# Patient Record
Sex: Male | Born: 1995 | Race: White | Hispanic: No | Marital: Single | Smoking: Never smoker
Health system: Southern US, Community
[De-identification: ages and names within clinical notes are randomized; demographics above are authoritative.]

## PROBLEM LIST (undated history)

## (undated) DIAGNOSIS — Z794 Long term (current) use of insulin: Secondary | ICD-10-CM

## (undated) DIAGNOSIS — B0089 Other herpesviral infection: Secondary | ICD-10-CM

## (undated) DIAGNOSIS — S62102A Fracture of unspecified carpal bone, left wrist, initial encounter for closed fracture: Secondary | ICD-10-CM

## (undated) DIAGNOSIS — E119 Type 2 diabetes mellitus without complications: Secondary | ICD-10-CM

## (undated) HISTORY — DX: Fracture of unspecified carpal bone, left wrist, initial encounter for closed fracture: S62.102A

## (undated) HISTORY — DX: Type 2 diabetes mellitus without complications: E11.9

## (undated) HISTORY — PX: OTHER SURGICAL HISTORY: SHX169

## (undated) HISTORY — DX: Long term (current) use of insulin: Z79.4

## (undated) HISTORY — DX: Other herpesviral infection: B00.89

---
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
## 2014-01-05 ENCOUNTER — Telehealth: Payer: Self-pay

## 2014-01-05 NOTE — Telephone Encounter (Signed)
pts mother request copy of immunizations in our office; pt going to college in fall. pts mother will ck with school for previous immunizations. Pt will pick up immunization record at front desk.

## 2014-01-11 ENCOUNTER — Ambulatory Visit (INDEPENDENT_AMBULATORY_CARE_PROVIDER_SITE_OTHER): Payer: BC Managed Care – PPO | Admitting: Internal Medicine

## 2014-01-11 ENCOUNTER — Encounter: Payer: Self-pay | Admitting: Internal Medicine

## 2014-01-11 VITALS — BP 126/82 | HR 80 | Temp 98.4°F | Wt 161.0 lb

## 2014-01-11 DIAGNOSIS — B86 Scabies: Secondary | ICD-10-CM

## 2014-01-11 MED ORDER — PERMETHRIN 5 % EX CREA: TOPICAL_CREAM | CUTANEOUS | Status: DC

## 2014-01-11 NOTE — Assessment & Plan Note (Signed)
Rx permethrin and gave education handout. If worsens, persists or changes notify us.

## 2014-01-11 NOTE — Progress Notes (Signed)
Pre visit review using our clinic review tool, if applicable. No additional management support is needed unless otherwise documented below in the visit note. 

## 2014-01-11 NOTE — Progress Notes (Signed)
   Subjective:    Patient ID: Patrick Williams, male    DOB: [birth year], 18 y.o.   MRN: [id number]  HPI  Pt in with rash on his ankles, wrist and some on his waist. He states present for 2 wks. Came up [facility] orientation [facility] [employer]. He slept on bed without bedsheets. Then next day got the rash. Slight rash around his umbilicus. Some on base of his thumbs.   No direct know family or friends with scabies but this is his concern.    Review of Systems  Constitutional: Negative.   Respiratory: Negative.   Cardiovascular: Negative.   Skin: Positive for rash.       Wrist, waist and ankles. Does itch.       Objective:   Physical Exam  Constitutional: He appears well-developed and well-nourished.  Cardiovascular: Normal rate, regular rhythm and normal heart sounds.   Pulmonary/Chest: Effort normal and breath sounds normal.  Skin: Skin is warm and dry. Rash noted.  Slight small  scabs on ventral wrists. Residual small areas of scabs and hyperpigmented region where had prior small scabs and slight redness to skin on both ankles and waist. No warmth, tenderness or discharge.           Assessment & Plan:

## 2014-01-11 NOTE — Progress Notes (Signed)
Subjective:    Patient ID: Patrick Williams, male    DOB: [birth year], 18 y.o.   MRN: [id number]  HPI  Pt presents to the clinic today with c/o a rash. This started 2 weeks ago. He reports the rash is located on his hands, wrist, arms, ankles and abdomen. It is itchy. It seems to be worse at night. He did go to orientation at [employer] and spent the night in one of the dorms. The rash started after that. He has tried hydrocortisone cream without relief. No one in his house has any similar symptoms.  Review of Systems    ,  Past Medical History  Diagnosis Date  . IDDM (insulin dependent diabetes mellitus) 1999    [facility].  No admissions for overnight stays with hyperglycemia since dx. as of 10/11.  . Wrist fracture, left 2007    resolved w/o deficit  . Herpes gladiatorum     history    Current Outpatient Prescriptions  Medication Sig Dispense Refill  . ibuprofen (ADVIL,MOTRIN) 200 MG tablet Take 200 mg by mouth as needed.        . insulin glargine (LANTUS) 100 UNIT/ML injection Inject 30 Units into the skin at bedtime.       . insulin lispro (HUMALOG KWIKPEN) 100 UNIT/ML injection Sliding scale per [facility]      . valACYclovir (VALTREX) 1000 MG tablet Take 1 tablet (1,000 mg total) by mouth 3 (three) times daily.  21 tablet  0   No current facility-administered medications for this visit.    No Known Allergies  Family History  Problem Relation Age of Onset  . CAD Neg Hx   . Hypertension Neg Hx   . Cancer Neg Hx   . Stroke Neg Hx   . Diabetes Neg Hx     History   Social History  . Marital Status: Single    Spouse Name: N/A    Number of Children: N/A  . Years of Education: N/A   Occupational History  . Not on file.   Social History Main Topics  . Smoking status: Never Smoker   . Smokeless tobacco: Never Used  . Alcohol Use: No  . Drug Use: No  . Sexual Activity: No   Other Topics Concern  . 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   Lives with Mother, younger sister and Step-Dad, 10 dogs, 3 cats, 1 pig   BioFather in [place]   Hobbies:  wrestling   Moved to [place] at age 89     Constitutional: Denies fever, malaise, fatigue, headache or abrupt weight changes.   Skin: Pt reports rash. Denies redness,  or ulcercations.    No other specific complaints in a complete review of systems (except as listed in HPI above).  Objective:   Physical Exam  BP 126/82  Pulse 80  Temp(Src) 98.4 F (36.9 C) (Oral)  Wt 161 lb (73.029 kg)  SpO2 98% Wt Readings from Last 3 Encounters:  01/11/14 161 lb (73.029 kg) (65%*, Z = 0.39)  07/21/13 155 lb 8 oz (70.534 kg) (61%*, Z = 0.27)  07/09/13 152 lb 8 oz (69.174 kg) (56%*, Z = 0.16)   * Growth percentiles are based on CDC 2-20 Years data.    General: Appears their stated age, well developed, well nourished in NAD. Skin: Multiple scabbed lesions noted in between fingers, on anterior surface of wrist, around the waist and ankles.  Cardiovascular: Normal rate and rhythm. S1,S2 noted.  No murmur, rubs or gallops noted. No JVD or BLE edema. No carotid bruits noted. Pulmonary/Chest: Normal effort and positive vesicular breath sounds. No respiratory distress. No wheezes, rales or ronchi noted.         Assessment & Plan:   Scabies:  Wash all clothes and sheets in hot water eRx for permetherin cream- use topically  RTC as needed or if symptoms persist or worsen

## 2014-01-11 NOTE — Patient Instructions (Signed)

## 2014-02-07 ENCOUNTER — Telehealth: Payer: Self-pay | Admitting: Family Medicine

## 2014-02-07 NOTE — Telephone Encounter (Signed)
In your IN box for completion.  

## 2014-02-07 NOTE — Telephone Encounter (Signed)
Pt dropped off college forms to be filled out Put on kims desk

## 2014-02-08 NOTE — Telephone Encounter (Signed)
No documented 2nd varicella. Patient notified and said he will discuss with mom and call back to schedule nurse visit if she wants him to have it. Paperwork placed up front for pick up.

## 2014-02-08 NOTE — Telephone Encounter (Signed)
Filled and placed in Kim's box. plz review [facility] - looks like may need 2nd varicella shot which he may receive if interested when he picks up forms.

## 2014-02-10 NOTE — Telephone Encounter (Signed)
Frann RiderLinda Hyatt immunization nurse at [facility] request immunization record from our office with office stamp and doctors signature faxed to [phone]. Verifed fax went thru.

## 2014-03-10 ENCOUNTER — Ambulatory Visit: Payer: BC Managed Care – PPO | Admitting: Internal Medicine

## 2014-03-11 ENCOUNTER — Ambulatory Visit (INDEPENDENT_AMBULATORY_CARE_PROVIDER_SITE_OTHER): Payer: BC Managed Care – PPO | Admitting: Internal Medicine

## 2014-03-11 ENCOUNTER — Encounter: Payer: Self-pay | Admitting: Internal Medicine

## 2014-03-11 VITALS — BP 120/64 | HR 71 | Temp 98.8°F | Wt 169.0 lb

## 2014-03-11 DIAGNOSIS — R590 Localized enlarged lymph nodes: Secondary | ICD-10-CM

## 2014-03-11 DIAGNOSIS — R599 Enlarged lymph nodes, unspecified: Secondary | ICD-10-CM

## 2014-03-11 LAB — CBC WITH DIFFERENTIAL/PLATELET
Basophils Absolute: 0 10*3/uL (ref 0.0–0.1)
Basophils Relative: 0 % (ref 0–1)
EOS ABS: 0.1 10*3/uL (ref 0.0–0.7)
EOS PCT: 2 % (ref 0–5)
HCT: 41.3 % (ref 39.0–52.0)
HEMOGLOBIN: 14.5 g/dL (ref 13.0–17.0)
LYMPHS ABS: 2.2 10*3/uL (ref 0.7–4.0)
Lymphocytes Relative: 41 % (ref 12–46)
MCH: 30 pg (ref 26.0–34.0)
MCHC: 35.1 g/dL (ref 30.0–36.0)
MCV: 85.5 fL (ref 78.0–100.0)
MONO ABS: 0.3 10*3/uL (ref 0.1–1.0)
MONOS PCT: 6 % (ref 3–12)
Neutro Abs: 2.7 10*3/uL (ref 1.7–7.7)
Neutrophils Relative %: 51 % (ref 43–77)
Platelets: 239 10*3/uL (ref 150–400)
RBC: 4.83 MIL/uL (ref 4.22–5.81)
RDW: 12.6 % (ref 11.5–15.5)
WBC: 5.3 10*3/uL (ref 4.0–10.5)

## 2014-03-11 NOTE — Progress Notes (Addendum)
   Subjective:    Patient ID: Patrick Williams, male    DOB: December 21, 1995, 18 y.o.   MRN: [id number]  HPI    Review of Systems     Objective:   Physical Exam        Assessment & Plan:

## 2014-03-11 NOTE — Progress Notes (Signed)
Pre visit review using our clinic review tool, if applicable. No additional management support is needed unless otherwise documented below in the visit note. 

## 2014-03-11 NOTE — Progress Notes (Signed)
Subjective:    Patient ID: Patrick Williams, male    DOB: 1996/01/01, 18 y.o.   MRN: [id number]  HPI  Pt presents to the clinic today with c/o swollen lymph nodes. He noticed this 2-3 weeks ago. It is swollen under his chin by his right ear. It was tender at one point but the tenderness has resolved. He reports that the swelling does not seem to be decreasing. He denies any URI symptoms. He denies fever, chills or weight loss.  Review of Systems      Past Medical History  Diagnosis Date  . IDDM (insulin dependent diabetes mellitus) 1999    followed at [facility].  No admissions for overnight stays with hyperglycemia since dx. as of 10/11.  . Wrist fracture, left 2007    resolved w/o deficit  . Herpes gladiatorum     history    Current Outpatient Prescriptions  Medication Sig Dispense Refill  . ibuprofen (ADVIL,MOTRIN) 200 MG tablet Take 200 mg by mouth as needed.        . insulin glargine (LANTUS) 100 UNIT/ML injection Inject 30 Units into the skin at bedtime.       . insulin lispro (HUMALOG KWIKPEN) 100 UNIT/ML injection Sliding scale per [facility]      . permethrin (ELIMITE) 5 % cream Apply to back of neck to soles of feet. Rinse of 10-14 hours after tx. Repeat in 2wks if needed.  60 g  0  . valACYclovir (VALTREX) 1000 MG tablet Take 1 tablet (1,000 mg total) by mouth 3 (three) times daily.  21 tablet  0   No current facility-administered medications for this visit.    No Known Allergies  Family History  Problem Relation Age of Onset  . CAD Neg Hx   . Hypertension Neg Hx   . Cancer Neg Hx   . Stroke Neg Hx   . Diabetes Neg Hx     History   Social History  . Marital Status: Single    Spouse Name: N/A    Number of Children: N/A  . Years of Education: N/A   Occupational History  . Not on file.   Social History Main Topics  . Smoking status: Never Smoker   . Smokeless tobacco: Never Used  . Alcohol Use: No  . Drug Use: No  . Sexual Activity: No   Other Topics Concern    . 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   Lives with Mother, younger sister and Step-Dad, 10 dogs, 3 cats, 1 pig   BioFather in [place]   Hobbies:  wrestling   Moved to  at age 71     Constitutional: Denies fever, malaise, fatigue, headache or abrupt weight changes.  HEENT: Denies eye pain, eye redness, ear pain, ringing in the ears, wax buildup, runny nose, nasal congestion, bloody nose, or sore throat. Respiratory: Denies difficulty breathing, shortness of breath, cough or sputum production.   Skin: Pt reports swollen lymph nodes. Denies redness, rashes, lesions or ulcercations.    No other specific complaints in a complete review of systems (except as listed in HPI above).  Objective:   Physical Exam  BP 120/64  Pulse 71  Temp(Src) 98.8 F (37.1 C) (Oral)  Wt 169 lb (76.658 kg)  SpO2 99% Wt Readings from Last 3 Encounters:  03/11/14 169 lb (76.658 kg) (74%*, Z = 0.65)  01/11/14 161 lb (73.029 kg) (65%*, Z = 0.39)  07/21/13  155 lb 8 oz (70.534 kg) (61%*, Z = 0.27)   * Growth percentiles are based on CDC 2-20 Years data.    General: Appears his stated age, well developed, well nourished in NAD. HEENT:  Ears: bilateral cerumen impaction; Throat/Mouth: Teeth present, mucosa pink and moist, no exudate, lesions or ulcerations noted. Right side preauricular lymphadenopathy noted. Mobile and soft, < 1 cm, nontender. Cardiovascular: Normal rate and rhythm. S1,S2 noted.  No murmur, rubs or gallops noted.  Pulmonary/Chest: Normal effort and positive vesicular breath sounds. No respiratory distress. No wheezes, rales or ronchi noted.        Assessment & Plan:   Preauricular lymphadenopathy:  No red flags today Will get CBC with diff If lymph node becomes larger, let me know and we will ultrasound it  RTC as needed or if symptoms persist or worsen

## 2014-03-11 NOTE — Patient Instructions (Signed)

## 2014-04-29 ENCOUNTER — Ambulatory Visit: Payer: BC Managed Care – PPO

## 2014-05-04 ENCOUNTER — Ambulatory Visit (INDEPENDENT_AMBULATORY_CARE_PROVIDER_SITE_OTHER): Payer: BC Managed Care – PPO

## 2014-05-04 ENCOUNTER — Ambulatory Visit: Payer: BC Managed Care – PPO

## 2014-05-04 DIAGNOSIS — Z23 Encounter for immunization: Secondary | ICD-10-CM

## 2014-09-27 ENCOUNTER — Ambulatory Visit (INDEPENDENT_AMBULATORY_CARE_PROVIDER_SITE_OTHER): Payer: BLUE CROSS/BLUE SHIELD | Admitting: Family Medicine

## 2014-09-27 ENCOUNTER — Encounter: Payer: Self-pay | Admitting: Family Medicine

## 2014-09-27 VITALS — BP 124/78 | HR 88 | Temp 98.1°F | Wt 167.0 lb

## 2014-09-27 DIAGNOSIS — B079 Viral wart, unspecified: Secondary | ICD-10-CM

## 2014-09-27 DIAGNOSIS — B078 Other viral warts: Secondary | ICD-10-CM | POA: Insufficient documentation

## 2014-09-27 NOTE — Progress Notes (Signed)
Pre visit review using our clinic review tool, if applicable. No additional management support is needed unless otherwise documented below in the visit note. 

## 2014-09-27 NOTE — Progress Notes (Signed)
   BP 124/78 mmHg  Pulse 88  Temp(Src) 98.1 F (36.7 C) (Oral)  Wt 167 lb (75.751 kg)  SpO2 98%   CC: check wart on R hand  Subjective:    Patient ID: Patrick Williams, male    DOB: [birth year], 19 y.o.   MRN: [id number]  HPI: Patrick Williams is a 19 y.o. male presenting on 09/27/2014 for Verrucous Vulgaris   "wart on R hand that won't go away". Actually several spots - R index dorsal MCP as well as R anterior knee and posterior calf. Present for last 3-4 weeks. Has tried freeze off and salicylic acid x 1 week.   No itching. Painful.  T1DM - sugars overall stable. Latest A1c 7s. [facility] Lab Results  Component Value Date   HGBA1C 7.0 02/19/2007    Relevant past medical, surgical, family and social history reviewed and updated as indicated. Interim medical history since our last visit reviewed. Allergies and medications reviewed and updated. Current Outpatient Prescriptions on File Prior to Visit  Medication Sig  . ibuprofen (ADVIL,MOTRIN) 200 MG tablet Take 200 mg by mouth as needed.    . insulin glargine (LANTUS) 100 UNIT/ML injection Inject 30 Units into the skin at bedtime.   . insulin lispro (HUMALOG KWIKPEN) 100 UNIT/ML injection Sliding scale per [facility]   No current facility-administered medications on file prior to visit.    Review of Systems Per HPI unless specifically indicated above     Objective:    BP 124/78 mmHg  Pulse 88  Temp(Src) 98.1 F (36.7 C) (Oral)  Wt 167 lb (75.751 kg)  SpO2 98%  Wt Readings from Last 3 Encounters:  09/27/14 167 lb (75.751 kg) (69 %*, Z = 0.51)  03/11/14 169 lb (76.658 kg) (74 %*, Z = 0.65)  01/11/14 161 lb (73.029 kg) (65 %*, Z = 0.39)   * Growth percentiles are based on CDC 2-20 Years data.    Physical Exam  Constitutional: He appears well-developed and well-nourished. No distress.  Musculoskeletal: He exhibits no edema.  Skin: Skin is warm and dry.  R anterior knee with 2 warts nearby, L lateral posterior  lower calf with smaller wart R dorsal MCPJ with 1cm exophytic growth with central crack/erosion.  Nursing note and vitals reviewed.  Liquid nitrogen was applied for 10-12 seconds to the skin lesion (knee and calf) and the expected blistering or scabbing reaction explained. Do not pick at the area. Return if lesion fails to fully resolve.    Assessment & Plan:   Problem List Items Addressed This Visit    Verruca vulgaris - Primary    R leg lesions x2 treated with LN2 therapy today. R hand lesion not treated 2/2 already irritated. rec continue salicylic acid home treatment, discussed treatment strategy, discussed duct tape use. Update if not improving with treatment, discussed possible need for rpt LN2 therapy.          Follow up plan: Return if symptoms worsen or fail to improve.

## 2014-09-27 NOTE — Patient Instructions (Addendum)
Leg warts freezed off today - typical reaction is blistering then peeling of wart. May need to be repeated. Keep area clean and dry. For hand - continue using salicylic acid 4-5 days in a row then allow skin to heal. Continue this process. Update us if no improvement in 3-4 wks.

## 2014-09-27 NOTE — Assessment & Plan Note (Addendum)
R leg lesions x2 treated with LN2 therapy today. R hand lesion not treated 2/2 already irritated. rec continue salicylic acid home treatment, discussed treatment strategy, discussed duct tape use. Update if not improving with treatment, discussed possible need for rpt LN2 therapy.

## 2014-12-19 ENCOUNTER — Telehealth: Payer: Self-pay

## 2014-12-19 NOTE — Telephone Encounter (Signed)
Diabetic Bundle. I contacted the pt and advised his A1C blood test is due. Pt declined to make lab appointment at this time.

## 2015-04-21 ENCOUNTER — Ambulatory Visit (INDEPENDENT_AMBULATORY_CARE_PROVIDER_SITE_OTHER): Payer: BLUE CROSS/BLUE SHIELD

## 2015-04-21 DIAGNOSIS — Z23 Encounter for immunization: Secondary | ICD-10-CM | POA: Diagnosis not present

## 2016-02-22 LAB — HEMOGLOBIN A1C: Hemoglobin A1C: 8.2

## 2016-02-22 LAB — MICROALBUMIN, URINE

## 2016-05-13 ENCOUNTER — Telehealth: Payer: Self-pay | Admitting: Family Medicine

## 2016-05-13 NOTE — Telephone Encounter (Signed)
PT called to reschedule his cpe for tomorrow 11/14 because he is in class. Your next time is in January. Is it ok to schedule him in a 30 min spot the week after Thanksgiving?

## 2016-05-13 NOTE — Telephone Encounter (Signed)
Ok to do thanks. 

## 2016-05-14 ENCOUNTER — Encounter: Payer: BLUE CROSS/BLUE SHIELD | Admitting: Family Medicine

## 2016-05-29 ENCOUNTER — Encounter: Payer: Self-pay | Admitting: Family Medicine

## 2016-05-29 ENCOUNTER — Ambulatory Visit (INDEPENDENT_AMBULATORY_CARE_PROVIDER_SITE_OTHER): Payer: BLUE CROSS/BLUE SHIELD | Admitting: Family Medicine

## 2016-05-29 VITALS — BP 122/80 | HR 68 | Temp 98.2°F | Ht 69.75 in | Wt 177.5 lb

## 2016-05-29 DIAGNOSIS — Z113 Encounter for screening for infections with a predominantly sexual mode of transmission: Secondary | ICD-10-CM

## 2016-05-29 DIAGNOSIS — Z0000 Encounter for general adult medical examination without abnormal findings: Secondary | ICD-10-CM | POA: Diagnosis not present

## 2016-05-29 DIAGNOSIS — E109 Type 1 diabetes mellitus without complications: Secondary | ICD-10-CM | POA: Diagnosis not present

## 2016-05-29 NOTE — Patient Instructions (Addendum)
Hearing and vision today Labs today Urine check today Good to see you, return as needed or in 1 year for next physical.  Preventive Care for [place], Male The transition to life after high school as a young adult can be a stressful time with many changes. You may start seeing a primary care physician instead of a pediatrician. This is the time when your health care becomes your responsibility. Preventive care refers to lifestyle choices and visits with your health care provider that can promote health and wellness. What does preventive care include?  A yearly physical exam. This is also called an annual wellness visit.  Dental exams once or twice a year.  Routine eye exams. Ask your health care provider how often you should have your eyes checked.  Personal lifestyle choices, including:  Daily care of your teeth and gums.  Regular physical activity.  Eating a healthy diet.  Avoiding tobacco and drug use.  Avoiding or limiting alcohol use.  Practicing safe sex. What happens during an annual wellness visit? Preventive care starts with a yearly visit to your primary care physician. The services and screenings done by your health care provider during your annual wellness visit will depend on your overall health, lifestyle risk factors, and family history of disease. Counseling  Your health care provider may ask you questions about:  Past medical problems and your family's medical history.  Medicines or supplements that you take.  Health insurance and access to health care.  Alcohol, tobacco, and drug use, including use of any bodybuilding drugs (anabolic steroids).  Your safety at home, work, or school.  Access to firearms.  Emotional well-being and how you cope with stress.  Relationship well-being.  Diet, exercise, and sleep habits.  Your sexual health and activity. Screening  You may have the following tests or measurements:  Height, weight, and  BMI.  Blood pressure.  Lipid and cholesterol levels.  Tuberculosis skin test.  Skin exam.  Vision and hearing tests.  Genital exam to check for testicular cancer or hernias.  Screening test for hepatitis.  Screening tests for STDs (sexually transmitted diseases), if you are at risk. Vaccines  Your health care provider may recommend certain vaccines, such as:  Influenza vaccine. This is recommended every year.  Tetanus, diphtheria, and acellular pertussis (Tdap, Td) vaccine. You may need a Td booster every 10 years.  Varicella vaccine. You may need this if you have not been vaccinated.  HPV vaccine. If you are 67 or younger, you may need three doses over 6 months.  Measles, mumps, and rubella (MMR) vaccine. You may need at least one dose of MMR. You may also need a second dose.  Pneumococcal 13-valent conjugate (PCV13) vaccine. You may need this if you have certain conditions and have not been vaccinated.  Pneumococcal polysaccharide (PPSV23) vaccine. You may need one or two doses if you smoke cigarettes or if you have certain conditions.  Meningococcal vaccine. One dose is recommended if you are age 58-21 years and a first-year college student living in a residence hall, or if you have one of several medical conditions. You may also need additional booster doses.  Hepatitis A vaccine. You may need this if you have certain conditions or if you travel or work in places where you may be exposed to hepatitis A.  Hepatitis B vaccine. You may need this if you have certain conditions or if you travel or work in places where you may be exposed to hepatitis B.  Haemophilus influenzae type b (Hib) vaccine. You may need this if you have certain risk factors. Talk to your health care provider about which screenings and vaccines you need and how often you need them. What steps can I take to develop healthy behaviors?  Have regular preventive health care visits with your primary care  physician and dentist.  Eat a healthy diet.  Drink enough fluid to keep your urine clear or pale yellow.  Stay active. Exercise at least 30 minutes 5 or more days of the week.  Use alcohol responsibly.  Maintain a healthy weight.  Do not use any products that contain nicotine, such as cigarettes, chewing tobacco, and e-cigarettes. If you need help quitting, ask your health care provider.  Do not use drugs.  Practice safe sex. This includes using condoms to prevent STDs or an unwanted pregnancy.  Find healthy ways to manage stress. How can I protect myself from injury? Injuries from violence or accidents are the leading cause of death among young adults and can often be prevented. Take these steps to help protect yourself:  Always wear your seat belt while driving or riding in a vehicle.  Do not drive if you have been drinking alcohol. Do not ride with someone who has been drinking.  Do not drive when you are tired or distracted. Do not text while driving.  Wear a helmet and other protective equipment during sports activities.  If you have firearms in your house, make sure you follow all gun safety procedures.  Seek help if you have been bullied, physically abused, or sexually abused.  Avoid fighting.  Use the Internet responsibly to avoid dangers such as online bullying. What can I do to cope with stress? Young adults may face many new challenges that can be stressful, such as finding a job, going to college, moving away from home, managing money, being in a relationship, getting married, and having children. To manage stress:  Avoid known stressful situations when you can.  Exercise regularly.  Find a stress-reducing activity that works best for you. Examples include meditation, yoga, listening to music, or reading.  Spend time in nature.  Keep a journal to write about your stress and how you respond.  Talk to your health care provider about stress. He or she may  suggest counseling.  Spend time with supportive friends or family.  Do not cope with stress by:  Drinking alcohol or using drugs.  Smoking cigarettes.  Eating. Where can I get more information? Learn more about preventive care and healthy habits from:  U.S. Preventive Services Task Force: StageSync.si  National Adolescent and [place]: StrategicRoad.nl  American Academy of Pediatrics Bright Futures: https://brightfutures.MemberVerification.co.za  Society for Adolescent Health and Medicine: MoralBlog.co.za.aspx  PodExchange.nl: ToyLending.fr This information is not intended to replace advice given to you by your health care provider. Make sure you discuss any questions you have with your health care provider. Document Released: 11/02/2015 Document Revised: 11/23/2015 Document Reviewed: 11/02/2015 Elsevier Interactive Patient Education  2017 [employer].

## 2016-05-29 NOTE — Progress Notes (Signed)
Pre visit review using our clinic review tool, if applicable. No additional management support is needed unless otherwise documented below in the visit note. 

## 2016-05-29 NOTE — Assessment & Plan Note (Signed)
Followed by endocrinology. A1c elevated recently.

## 2016-05-29 NOTE — Assessment & Plan Note (Addendum)
Preventative protocols reviewed and updated unless pt declined. Discussed healthy diet and lifestyle.  Healthy 20 yo, excelling at school.  Consider 2nd varicella vaccine - pt declines today.

## 2016-05-29 NOTE — Progress Notes (Signed)
BP 122/80   Pulse 68   Temp 98.2 F (36.8 C) (Oral)   Ht 5' 9.75" (1.772 m)   Wt 177 lb 8 oz (80.5 kg)   BMI 25.65 kg/m    CC: CPE Subjective:    Patient ID: Patrick Williams, male    DOB: 03/04/1996, 19 y.o.   MRN: [id number]  HPI: Patrick Williams is a 20 y.o. male presenting on 05/29/2016 for Annual Exam   [facility] Last A1c 8.2%. He did receive flu and pneumonvax this week. Foot exam yesterday. Due for eye exam. On lantus and novolog.   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   Smoking - none  Alcohol - none. Discussed alcohol intake when he turns 20 (next month) Rec drugs - none  Sexual activity - 2 partners in last year, 100% protection.   [employer] at [employer] overall [employer].  Activity: working out 3d/wk Diet: good water daily, fruits/vegetables.   "[employer] of [employer]" [employer] at [employer] studying kinesiology.Wrestler. Lives with Mother, younger sister and Step-Dad, 10 dogs, 3 cats, 1 pig BioFather [place]obbies: wrestling Moved to [place] at age 89   Relevant past medical, surgical, family and social history reviewed and updated as indicated. Interim medical history since our last visit reviewed. Allergies and medications reviewed and updated. Current Outpatient Prescriptions on File Prior to Visit  Medication Sig  . ibuprofen (ADVIL,MOTRIN) 200 MG tablet Take 200 mg by mouth as needed.    . insulin glargine (LANTUS) 100 UNIT/ML injection Inject 36 Units into the skin at bedtime.    No current facility-administered medications on file prior to visit.     Review of Systems  Constitutional: Negative for activity change, appetite change, chills, fatigue, fever and unexpected weight change.  HENT: Negative for hearing loss.   Eyes: Negative for visual disturbance.  Respiratory: Negative for cough, chest tightness, shortness of breath and wheezing.   Cardiovascular: Negative for chest pain, palpitations and leg  swelling.  Gastrointestinal: Negative for abdominal distention, abdominal pain, blood in stool, constipation, diarrhea, nausea and vomiting.  Genitourinary: Negative for difficulty urinating and hematuria.  Musculoskeletal: Negative for arthralgias, myalgias and neck pain.  Skin: Negative for rash.  Neurological: Negative for dizziness, seizures, syncope and headaches.  Hematological: Negative for adenopathy. Does not bruise/bleed easily.  Psychiatric/Behavioral: Negative for dysphoric mood. The patient is not nervous/anxious.    Per HPI unless specifically indicated in ROS section     Objective:    BP 122/80   Pulse 68   Temp 98.2 F (36.8 C) (Oral)   Ht 5' 9.75" (1.772 m)   Wt 177 lb 8 oz (80.5 kg)   BMI 25.65 kg/m   Wt Readings from Last 3 Encounters:  05/29/16 177 lb 8 oz (80.5 kg)  09/27/14 167 lb (75.8 kg) (69 %, Z= 0.51)*  03/11/14 169 lb (76.7 kg) (74 %, Z= 0.65)*   * Growth percentiles are based on CDC 2-20 Years data.    Physical Exam  Constitutional: He is oriented to person, place, and time. He appears well-developed and well-nourished. No distress.  HENT:  Head: Normocephalic and atraumatic.  Right Ear: Hearing, tympanic membrane, external ear and ear canal normal.  Left Ear: Hearing, tympanic membrane, external ear and ear canal normal.  Nose: Nose normal.  Mouth/Throat: Uvula is midline, oropharynx is clear and moist and mucous membranes are normal. No oropharyngeal exudate, posterior oropharyngeal edema or posterior oropharyngeal erythema.  Eyes: Conjunctivae  and EOM are normal. Pupils are equal, round, and reactive to light. No scleral icterus.  Neck: Normal range of motion. Neck supple. No thyromegaly present.  Cardiovascular: Normal rate, regular rhythm, normal heart sounds and intact distal pulses.   No murmur heard. Pulses:      Radial pulses are 2+ on the right side, and 2+ on the left side.  Pulmonary/Chest: Effort normal and breath sounds normal. No  respiratory distress. He has no wheezes. He has no rales.  Abdominal: Soft. Bowel sounds are normal. He exhibits no distension and no mass. There is no tenderness. There is no rebound and no guarding.  Musculoskeletal: Normal range of motion. He exhibits no edema.  Lymphadenopathy:    He has no cervical adenopathy.  Neurological: He is alert and oriented to person, place, and time.  CN grossly intact, station and gait intact  Skin: Skin is warm and dry. No rash noted.  Psychiatric: He has a normal mood and affect. His behavior is normal. Judgment and thought content normal.  Nursing note and vitals reviewed.  Results for orders placed or performed in visit on 05/29/16  Microalbumin, urine  Result Value Ref Range   Microalb, Ur done   Hemoglobin A1c  Result Value Ref Range   Hemoglobin A1C 8.2        Assessment & Plan:   Problem List Items Addressed This Visit    Health maintenance examination - Primary    Preventative protocols reviewed and updated unless pt declined. Discussed healthy diet and lifestyle.  Healthy 20 yo, excelling at school.  Consider 2nd varicella vaccine - pt declines today.       Type 1 diabetes mellitus (HCC)    Followed by endocrinology. A1c elevated recently.      Relevant Medications   insulin aspart (NOVOLOG FLEXPEN) 100 UNIT/ML FlexPen    Other Visit Diagnoses    Screen for STD (sexually transmitted disease)       Relevant Orders   HIV antibody   RPR   GC/Chlamydia Probe Amp       Follow up plan: Return in about 1 year (around 05/29/2017) for annual exam, prior fasting for blood work.  Eustaquio BoydenJavier Xion Debruyne, MD

## 2016-05-30 ENCOUNTER — Telehealth: Payer: Self-pay | Admitting: Family Medicine

## 2016-05-30 LAB — HIV ANTIBODY (ROUTINE TESTING W REFLEX): HIV: NONREACTIVE

## 2016-05-30 LAB — RPR

## 2016-05-30 LAB — GC/CHLAMYDIA PROBE AMP
CT Probe RNA: NOT DETECTED
GC Probe RNA: NOT DETECTED

## 2016-05-30 NOTE — Telephone Encounter (Signed)
Message left for patient

## 2016-05-30 NOTE — Telephone Encounter (Signed)
Pt returned call about lab please call [phone] Thanks

## 2016-07-31 ENCOUNTER — Telehealth: Payer: Self-pay | Admitting: Family Medicine

## 2016-07-31 NOTE — Telephone Encounter (Signed)
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 I placed in Rx tower. Pt dropped the packet with endo Dr Cain and theRandie Heinzy sent pg 2 for pcp to fill out. Call cell with any questions. Pt is requesting for to be faxed to [phone]

## 2016-08-01 NOTE — Telephone Encounter (Signed)
Patient notified form is done.  [facility] to [facility].

## 2016-08-01 NOTE — Telephone Encounter (Signed)
Form is in your in box

## 2016-08-01 NOTE — Telephone Encounter (Signed)
Filled and in Kims' box. plz fax.

## 2017-04-30 ENCOUNTER — Ambulatory Visit: Payer: BLUE CROSS/BLUE SHIELD

## 2018-01-08 ENCOUNTER — Other Ambulatory Visit: Payer: Self-pay

## 2018-01-08 ENCOUNTER — Encounter (HOSPITAL_BASED_OUTPATIENT_CLINIC_OR_DEPARTMENT_OTHER): Payer: Self-pay | Admitting: Emergency Medicine

## 2018-01-08 ENCOUNTER — Emergency Department (HOSPITAL_BASED_OUTPATIENT_CLINIC_OR_DEPARTMENT_OTHER)
Admission: EM | Admit: 2018-01-08 | Discharge: 2018-01-08 | Disposition: A | Discharge status: Home / Self Care | Payer: BLUE CROSS/BLUE SHIELD | Attending: Emergency Medicine | Admitting: Emergency Medicine

## 2018-01-08 DIAGNOSIS — W268XXA Contact with other sharp object(s), not elsewhere classified, initial encounter: Secondary | ICD-10-CM | POA: Insufficient documentation

## 2018-01-08 DIAGNOSIS — Y929 Unspecified place or not applicable: Secondary | ICD-10-CM | POA: Diagnosis not present

## 2018-01-08 DIAGNOSIS — Y93H2 Activity, gardening and landscaping: Secondary | ICD-10-CM | POA: Diagnosis not present

## 2018-01-08 DIAGNOSIS — Y999 Unspecified external cause status: Secondary | ICD-10-CM | POA: Insufficient documentation

## 2018-01-08 DIAGNOSIS — S91011A Laceration without foreign body, right ankle, initial encounter: Secondary | ICD-10-CM | POA: Insufficient documentation

## 2018-01-08 DIAGNOSIS — Z5321 Procedure and treatment not carried out due to patient leaving prior to being seen by health care provider: Secondary | ICD-10-CM | POA: Diagnosis not present

## 2018-01-08 NOTE — ED Notes (Signed)
Called pt to take to treatment room  No answer from lobby  Registration clerk states they walked out

## 2018-01-08 NOTE — ED Triage Notes (Signed)
Pt was cutting branches with a sickle and it slipped and cut him on the right ankle area  Bleeding controlled at this time

## 2018-01-09 ENCOUNTER — Ambulatory Visit: Payer: Self-pay | Admitting: Family Medicine

## 2018-01-09 NOTE — Telephone Encounter (Signed)
Pt cutting branches of a bush with a machete and he cut his right lower leg. This occurred at 7:30 pm last night. Pt wound is 3-4 inches long. Pt stated that his muscle feels tight around the calf area and ankle.  Pt went to the ED last night and left after 6 hours because he was not evaluated. Pt's father wrapped the wound in gauze. Last tetanus was documented as given in 2008. Pt stated he received a tetanus back in 2015.  Pt wanting a dr appointment and does not want to go back to the ED. Called PCP office and [facility] to see if there was a provider who could see him and stitch the wound. [facility] who will speak to the pt.  Reason for Disposition . Skin is split open or gaping (or length > 1/2 inch or 12 mm)  Answer Assessment - Initial Assessment Questions 1. MECHANISM: "How did the injury happen?" (e.g., twisting injury, direct blow)      Accidentally hit himself with machete  2. ONSET: "When did the injury happen?" (Minutes or hours ago)      Pt was cutting lower branches of a bush and hit hit leg with a machete 3. LOCATION: "Where is the injury located?"      Right lower leg by his ankle to the inside 4. APPEARANCE of INJURY: "What does the injury look like?"  (e.g., deformity of leg)     Looks like a laceration 5. SEVERITY: "Can you put weight on that leg?" "Can you walk?"      Yes-yes 6. SIZE: For cuts, bruises, or swelling, ask: "How large is it?" (e.g., inches or centimeters)      3-4 inches 7. PAIN: "Is there pain?" If so, ask: "How bad is the pain?"  (Scale 1-10; or mild, moderate, severe)     Yes muscle pain feels tight around the calf area 8. TETANUS: For any breaks in the skin, ask: "When was the last tetanus booster?"     2015 9. OTHER SYMPTOMS: "Do you have any other symptoms?"      No  10. PREGNANCY: "Is there any chance you are pregnant?" "When was your last menstrual period?"       n/a  Protocols used: LEG INJURY-A-AH

## 2018-01-09 NOTE — Telephone Encounter (Signed)
Agree with urgent eval today.

## 2018-01-09 NOTE — Telephone Encounter (Signed)
I spoke with pt and he said cut was pretty deep, advised needed to go to ED for sutures ASAP; pt said he would not go back to ED. I spoke with Dr Reece AgarG and he said due to injury and time injured [facility]. I spoke with pt and advised Dr Timoteo ExposeG's comments; pt voiced understanding and will go to an UC or different ED. Pt will ck with his parents to [facility] but [facility] FYI to DR G.

## 2018-01-12 NOTE — ED Notes (Signed)
Follow up call made  No answer  01/12/18  0832  s Varnell Donate rn

## 2018-04-07 LAB — HEMOGLOBIN A1C: HEMOGLOBIN A1C: 8.2

## 2018-04-07 LAB — LIPID PANEL
Cholesterol: 134 (ref 0–200)
HDL: 56 (ref 35–70)
LDL CALC: 28
TRIGLYCERIDES: 42 (ref 40–160)

## 2018-04-07 LAB — TSH: TSH: 0.96 (ref 0.41–5.90)

## 2018-04-09 ENCOUNTER — Ambulatory Visit: Payer: BLUE CROSS/BLUE SHIELD

## 2018-04-14 ENCOUNTER — Encounter: Payer: Self-pay | Admitting: Family Medicine

## 2019-03-29 ENCOUNTER — Ambulatory Visit (INDEPENDENT_AMBULATORY_CARE_PROVIDER_SITE_OTHER): Payer: Self-pay | Admitting: Family Medicine

## 2019-03-29 ENCOUNTER — Encounter: Payer: Self-pay | Admitting: Family Medicine

## 2019-03-29 VITALS — Ht 70.0 in | Wt 175.0 lb

## 2019-03-29 DIAGNOSIS — K122 Cellulitis and abscess of mouth: Secondary | ICD-10-CM

## 2019-03-29 MED ORDER — AMOXICILLIN 500 MG PO CAPS: 1000.0000 mg | ORAL_CAPSULE | Freq: Two times a day (BID) | ORAL | 0 refills | Status: AC

## 2019-03-29 NOTE — Progress Notes (Signed)
     Patrick Williams T. [name], MD Primary Care and Sports Medicine [facility] [place], [address] Phone: [phone]  FAX: [phone]  [name] - 23 y.o. male  MRN [id number]  Date of Birth: 1995/09/16  Visit Date: 03/29/2019  PCP: Ria Bush, MD  Referred by: Ria Bush, MD Chief Complaint  Patient presents with  . Sore Throat    started last week   Virtual Visit via Video Note:  I connected with  Patrick Williams on 03/29/2019  3:20 PM EDT by a video enabled telemedicine application and verified that I am speaking with the correct person using two identifiers.   Location patient: home computer, tablet, or smartphone Location provider: work or home office Consent: Verbal consent directly obtained from [name]. Persons participating in the virtual visit: patient, provider  I discussed the limitations of evaluation and management by telemedicine and the availability of in person appointments. The patient expressed understanding and agreed to proceed.  History of Present Illness:  Supportive care, amox if does not clear up.  He is having some irritation and enlargement of the uvula.  Actually does feel better today compared to yesterday.  He denies any other sore throat, fever, earache, sinus pressure, or any other symptoms.  Review of Systems as above: See pertinent positives and pertinent negatives per HPI No acute distress verbally  Past Medical History, Surgical History, Social History, Family History, Problem List, Medications, and Allergies have been reviewed and updated if relevant.   Observations/Objective/Exam:  An attempt was made to discern vital signs over the phone and per patient if applicable and possible.   General:    Alert, Oriented, appears well and in no acute distress HEENT:     Atraumatic, conjunctiva clear, no obvious abnormalities on inspection of external nose and ears.   Neck:    Normal movements of the head and neck Pulmonary:     On inspection no signs of respiratory distress, breathing rate appears normal, no obvious gross SOB, gasping or wheezing Cardiovascular:    No obvious cyanosis Musculoskeletal:    Moves all visible extremities without noticeable abnormality Psych / Neurological:     Pleasant and cooperative, no obvious depression or anxiety, speech and thought processing grossly intact  Assessment and Plan:    ICD-10-CM   1. Uvulitis  K12.2    I am and have him continue conservative care and if his symptoms do not improve after 2 or 3 days I told him to start his amoxicillin.  I discussed the assessment and treatment plan with the patient. The patient was provided an opportunity to ask questions and all were answered. The patient agreed with the plan and demonstrated an understanding of the instructions.   The patient was advised to call back or seek an in-person evaluation if the symptoms worsen or if the condition fails to improve as anticipated.  Follow-up: prn unless noted otherwise below No follow-ups on file.  Meds ordered this encounter  Medications  . amoxicillin (AMOXIL) 500 MG capsule    Sig: Take 2 capsules (1,000 mg total) by mouth 2 (two) times daily for 10 days.    Dispense:  40 capsule    Refill:  0   No orders of the defined types were placed in this encounter.   Signed,  Maud Deed. Adell Koval, MD

## 2019-04-28 ENCOUNTER — Telehealth: Payer: Self-pay | Admitting: Family Medicine

## 2019-04-28 NOTE — Telephone Encounter (Signed)
Will you schedule pt on 11/6 or 11/10 for CPE pls?

## 2019-04-28 NOTE — Telephone Encounter (Signed)
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 Pt states that if he does not have this filled out and sent back by Nov 29 then he will lose his license. Patient aware that he needs to have a physical done - last CPE was 05/2016, only seen acutely since 2017.  Pt open to any day.   Please advise where this patient can be worked in. Thanks.  I have seen Nov 6 and Nov 10th but it meets "session limits" and wont let me schedule.

## 2019-04-28 NOTE — Telephone Encounter (Signed)
Called pt on home phone number received no answer or voicemail. I called cell phone number and the mailbox was full.

## 2019-04-29 NOTE — Telephone Encounter (Signed)
Called pt but he said he will call back since he was driving.

## 2019-05-07 ENCOUNTER — Ambulatory Visit (INDEPENDENT_AMBULATORY_CARE_PROVIDER_SITE_OTHER): Payer: BC Managed Care – PPO | Admitting: Family Medicine

## 2019-05-07 ENCOUNTER — Other Ambulatory Visit: Payer: Self-pay

## 2019-05-07 ENCOUNTER — Encounter: Payer: Self-pay | Admitting: Family Medicine

## 2019-05-07 VITALS — BP 120/76 | HR 108 | Temp 98.2°F | Ht 69.75 in | Wt 174.6 lb

## 2019-05-07 DIAGNOSIS — Z0000 Encounter for general adult medical examination without abnormal findings: Secondary | ICD-10-CM

## 2019-05-07 DIAGNOSIS — E109 Type 1 diabetes mellitus without complications: Secondary | ICD-10-CM

## 2019-05-07 NOTE — Assessment & Plan Note (Signed)
Chronic, stable. Followed by [facility] endo.  DMV form filled out today.

## 2019-05-07 NOTE — Assessment & Plan Note (Signed)
Preventative protocols reviewed and updated unless pt declined. Discussed healthy diet and lifestyle.  

## 2019-05-07 NOTE — Progress Notes (Signed)
This visit was conducted in person.  BP 120/76 (BP Location: Left Arm, Patient Position: Sitting, Cuff Size: Normal)   Pulse (!) 108   Temp 98.2 F (36.8 C) (Temporal)   Ht 5' 9.75" (1.772 m)   Wt 174 lb 9 oz (79.2 kg)   SpO2 98%   BMI 25.23 kg/m    CC: CPE Subjective:    Patient ID: Patrick Williams, male    DOB: Jan 03, 1996, 23 y.o.   MRN: [id number]  HPI: Valerie Fredin is a 23 y.o. male presenting on 05/07/2019 for Annual Exam (Has [place] DMV form completed. )   T1DM followed by [facility] endo (PA Rexene Alberts). On lantus and novolog. Last A1c 7.8% (04/14/2019). Considering to start CGM, currently has contour meter.  Had labs through [facility] this year.   Has been drinking more caffeine recently.   Preventative: Flu shot yearly Tdap 2019 Pneumovax 2017 Seat belt use discussed Sunscreen use discussed. No changing moles on skin.  Smoking - none  Alcohol - 1-2 drinks twice a week.  Rec drugs - none  Not currently sexual activity - 2 partners in last year, 100% protection.   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 Occ: looking for a job - working for [employer]: wrestling Activity: working out 3d/wk at gym Diet: good water daily, fruits/vegetables.  Moved to [place] at age 62      Relevant past medical, surgical, family and social history reviewed and updated as indicated. Interim medical history since our last visit reviewed. Allergies and medications reviewed and updated. Outpatient Medications Prior to Visit  Medication Sig Dispense Refill  . ibuprofen (ADVIL,MOTRIN) 200 MG tablet Take 200 mg by mouth as needed.      . insulin aspart (NOVOLOG FLEXPEN) 100 UNIT/ML FlexPen Inject into the skin 3 (three) times daily with meals. Sliding scale with meals    . insulin glargine (LANTUS) 100 UNIT/ML injection Inject 36 Units into the skin at bedtime.      No  facility-administered medications prior to visit.      Per HPI unless specifically indicated in ROS section below Review of Systems  Constitutional: Negative for activity change, appetite change, chills, fatigue, fever and unexpected weight change.  HENT: Negative for hearing loss.   Eyes: Negative for visual disturbance.  Respiratory: Negative for cough, chest tightness, shortness of breath and wheezing.   Cardiovascular: Negative for chest pain, palpitations and leg swelling.  Gastrointestinal: Negative for abdominal distention, abdominal pain, blood in stool, constipation, diarrhea, nausea and vomiting.  Genitourinary: Negative for difficulty urinating and hematuria.  Musculoskeletal: Negative for arthralgias, myalgias and neck pain.  Skin: Negative for rash.  Neurological: Negative for dizziness, seizures, syncope and headaches.  Hematological: Negative for adenopathy. Does not bruise/bleed easily.  Psychiatric/Behavioral: Negative for dysphoric mood. The patient is not nervous/anxious.    Objective:    BP 120/76 (BP Location: Left Arm, Patient Position: Sitting, Cuff Size: Normal)   Pulse (!) 108   Temp 98.2 F (36.8 C) (Temporal)   Ht 5' 9.75" (1.772 m)   Wt 174 lb 9 oz (79.2 kg)   SpO2 98%   BMI 25.23 kg/m   Wt Readings from Last 3 Encounters:  05/07/19 174 lb 9 oz (79.2 kg)  03/29/19 175 lb (79.4 kg)  01/08/18 177 lb (80.3 kg)    Physical Exam Vitals signs and nursing note reviewed.  Constitutional:  General: He is not in acute distress.    Appearance: Normal appearance. He is well-developed. He is not ill-appearing.  HENT:     Head: Normocephalic and atraumatic.     Right Ear: Hearing, tympanic membrane, ear canal and external ear normal.     Left Ear: Hearing, tympanic membrane, ear canal and external ear normal.     Nose: Nose normal.     Mouth/Throat:     Mouth: Mucous membranes are moist.     Pharynx: Oropharynx is clear. Uvula midline. No posterior  oropharyngeal erythema.  Eyes:     General: No scleral icterus.    Extraocular Movements: Extraocular movements intact.     Conjunctiva/sclera: Conjunctivae normal.     Pupils: Pupils are equal, round, and reactive to light.  Neck:     Musculoskeletal: Normal range of motion and neck supple.  Cardiovascular:     Rate and Rhythm: Normal rate and regular rhythm.     Pulses: Normal pulses.          Radial pulses are 2+ on the right side and 2+ on the left side.     Heart sounds: Normal heart sounds. No murmur.  Pulmonary:     Effort: Pulmonary effort is normal. No respiratory distress.     Breath sounds: Normal breath sounds. No wheezing, rhonchi or rales.  Abdominal:     General: Abdomen is flat. Bowel sounds are normal. There is no distension.     Palpations: Abdomen is soft. There is no mass.     Tenderness: There is no abdominal tenderness. There is no guarding or rebound.     Hernia: No hernia is present.  Musculoskeletal: Normal range of motion.     Right lower leg: No edema.     Left lower leg: No edema.  Lymphadenopathy:     Cervical: No cervical adenopathy.  Skin:    General: Skin is warm and dry.     Findings: No rash.  Neurological:     General: No focal deficit present.     Mental Status: He is alert and oriented to person, place, and time.     Comments: CN grossly intact, station and gait intact  Psychiatric:        Mood and Affect: Mood normal.        Behavior: Behavior normal.        Thought Content: Thought content normal.        Judgment: Judgment normal.       Results for orders placed or performed in visit on 04/14/18  Lipid panel  Result Value Ref Range   Triglycerides 42 40 - 160   Cholesterol 134 0 - 200   HDL 56 35 - 70   LDL Cholesterol 28   Hemoglobin A1c  Result Value Ref Range   Hemoglobin A1C 8.2   TSH  Result Value Ref Range   TSH 0.96 0.41 - 5.90   Assessment & Plan:   Problem List Items Addressed This Visit    Type 1 diabetes  mellitus (HCC)    Chronic, stable. Followed by [facility] endo.  DMV form filled out today.       Health maintenance examination - Primary    Preventative protocols reviewed and updated unless pt declined. Discussed healthy diet and lifestyle.           No orders of the defined types were placed in this encounter.  No orders of the defined types were placed in this encounter.  Follow up plan: Return in about 2 years (around 05/06/2021) for annual exam, prior fasting for blood work.  Eustaquio Boyden, MD

## 2019-05-07 NOTE — Patient Instructions (Signed)
You are doing well today Return as needed or in 1-2 years for next physical.  Health Maintenance, Male Adopting a healthy lifestyle and getting preventive care are important in promoting health and wellness. Ask your health care provider about:  The right schedule for you to have regular tests and exams.  Things you can do on your own to prevent diseases and keep yourself healthy. What should I know about diet, weight, and exercise? Eat a healthy diet   Eat a diet that includes plenty of vegetables, fruits, low-fat dairy products, and lean protein.  Do not eat a lot of foods that are high in solid fats, added sugars, or sodium. Maintain a healthy weight Body mass index (BMI) is a measurement that can be used to identify possible weight problems. It estimates body fat based on height and weight. Your health care provider can help determine your BMI and help you achieve or maintain a healthy weight. Get regular exercise Get regular exercise. This is one of the most important things you can do for your health. Most adults should:  Exercise for at least 150 minutes each week. The exercise should increase your heart rate and make you sweat (moderate-intensity exercise).  Do strengthening exercises at least twice a week. This is in addition to the moderate-intensity exercise.  Spend less time sitting. Even light physical activity can be beneficial. Watch cholesterol and blood lipids Have your blood tested for lipids and cholesterol at 24 years of age, then have this test every 5 years. You may need to have your cholesterol levels checked more often if:  Your lipid or cholesterol levels are high.  You are older than 24 years of age.  You are at high risk for heart disease. What should I know about cancer screening? Many types of cancers can be detected early and may often be prevented. Depending on your health history and family history, you may need to have cancer screening at various  ages. This may include screening for:  Colorectal cancer.  Prostate cancer.  Skin cancer.  Lung cancer. What should I know about heart disease, diabetes, and high blood pressure? Blood pressure and heart disease  High blood pressure causes heart disease and increases the risk of stroke. This is more likely to develop in people who have high blood pressure readings, are of African descent, or are overweight.  Talk with your health care provider about your target blood pressure readings.  Have your blood pressure checked: ? Every 3-5 years if you are 24-37 years of age. ? Every year if you are 24 years old or older.  If you are between the ages of 66 and 68 and are a current or former smoker, ask your health care provider if you should have a one-time screening for abdominal aortic aneurysm (AAA). Diabetes Have regular diabetes screenings. This checks your fasting blood sugar level. Have the screening done:  Once every three years after age 22 if you are at a normal weight and have a low risk for diabetes.  More often and at a younger age if you are overweight or have a high risk for diabetes. What should I know about preventing infection? Hepatitis B If you have a higher risk for hepatitis B, you should be screened for this virus. Talk with your health care provider to find out if you are at risk for hepatitis B infection. Hepatitis C Blood testing is recommended for:  Everyone born from [birth year] through 1966.  Anyone with known  risk factors for hepatitis C. Sexually transmitted infections (STIs)  You should be screened each year for STIs, including gonorrhea and chlamydia, if: ? You are sexually active and are younger than 24 years of age. ? You are older than 24 years of age and your health care provider tells you that you are at risk for this type of infection. ? Your sexual activity has changed since you were last screened, and you are at increased risk for chlamydia or  gonorrhea. Ask your health care provider if you are at risk.  Ask your health care provider about whether you are at high risk for HIV. Your health care provider may recommend a prescription medicine to help prevent HIV infection. If you choose to take medicine to prevent HIV, you should first get tested for HIV. You should then be tested every 3 months for as long as you are taking the medicine. Follow these instructions at home: Lifestyle  Do not use any products that contain nicotine or tobacco, such as cigarettes, e-cigarettes, and chewing tobacco. If you need help quitting, ask your health care provider.  Do not use street drugs.  Do not share needles.  Ask your health care provider for help if you need support or information about quitting drugs. Alcohol use  Do not drink alcohol if your health care provider tells you not to drink.  If you drink alcohol: ? Limit how much you have to 0-2 drinks a day. ? Be aware of how much alcohol is in your drink. In the [place] one drink equals one 12 oz bottle of beer (355 mL), one 5 oz glass of wine (148 mL), or one 1 oz glass of hard liquor (44 mL). General instructions  Schedule regular health, dental, and eye exams.  Stay current with your vaccines.  Tell your health care provider if: ? You often feel depressed. ? You have ever been abused or do not feel safe at home. Summary  Adopting a healthy lifestyle and getting preventive care are important in promoting health and wellness.  Follow your health care provider's instructions about healthy diet, exercising, and getting tested or screened for diseases.  Follow your health care provider's instructions on monitoring your cholesterol and blood pressure. This information is not intended to replace advice given to you by your health care provider. Make sure you discuss any questions you have with your health care provider. Document Released: 12/14/2007 Document Revised: 06/10/2018  Document Reviewed: 06/10/2018 Elsevier Patient Education  2020 [employer].

## 2020-04-03 ENCOUNTER — Other Ambulatory Visit: Payer: Self-pay

## 2020-04-03 ENCOUNTER — Encounter: Payer: Self-pay | Admitting: Family Medicine

## 2020-04-03 ENCOUNTER — Ambulatory Visit (INDEPENDENT_AMBULATORY_CARE_PROVIDER_SITE_OTHER): Payer: BC Managed Care – PPO | Admitting: Family Medicine

## 2020-04-03 VITALS — BP 116/68 | HR 84 | Temp 97.7°F | Ht 69.75 in | Wt 179.6 lb

## 2020-04-03 DIAGNOSIS — Z23 Encounter for immunization: Secondary | ICD-10-CM

## 2020-04-03 DIAGNOSIS — B079 Viral wart, unspecified: Secondary | ICD-10-CM | POA: Diagnosis not present

## 2020-04-03 NOTE — Progress Notes (Signed)
This visit was conducted in person.  BP 116/68 (BP Location: Left Arm, Patient Position: Sitting, Cuff Size: Normal)   Pulse 84   Temp 97.7 F (36.5 C) (Temporal)   Ht 5' 9.75" (1.772 m)   Wt 179 lb 9 oz (81.4 kg)   SpO2 94%   BMI 25.95 kg/m    CC: skin lesions on fingers Subjective:    Patient ID: Patrick Williams, male    DOB: [birth year], 24 y.o.   MRN: [id number]  HPI: Patrick Williams is a 24 y.o. male presenting on 04/03/2020 for Skin Problem (C/o area tender areas on left pointer finger and thumb.  Seem to be warts. Noticed about 1 yr ago, worsening.  Concerned due to DM. )   1 yr h/o skin lesions to L index finger and thumb.  Growing despite home treatment with salicylic acid (compound W) Desires further treatment.      Relevant past medical, surgical, family and social history reviewed and updated as indicated. Interim medical history since our last visit reviewed. Allergies and medications reviewed and updated. Outpatient Medications Prior to Visit  Medication Sig Dispense Refill  . ibuprofen (ADVIL,MOTRIN) 200 MG tablet Take 200 mg by mouth as needed.      . insulin aspart (NOVOLOG FLEXPEN) 100 UNIT/ML FlexPen Inject into the skin 3 (three) times daily with meals. Sliding scale with meals    . insulin glargine (LANTUS) 100 UNIT/ML injection Inject 36 Units into the skin at bedtime.      No facility-administered medications prior to visit.     Per HPI unless specifically indicated in ROS section below Review of Systems Objective:  BP 116/68 (BP Location: Left Arm, Patient Position: Sitting, Cuff Size: Normal)   Pulse 84   Temp 97.7 F (36.5 C) (Temporal)   Ht 5' 9.75" (1.772 m)   Wt 179 lb 9 oz (81.4 kg)   SpO2 94%   BMI 25.95 kg/m   Wt Readings from Last 3 Encounters:  04/03/20 179 lb 9 oz (81.4 kg)  05/07/19 174 lb 9 oz (79.2 kg)  03/29/19 175 lb (79.4 kg)      Physical Exam Vitals and nursing note reviewed.  Constitutional:      Appearance: Normal  appearance. He is not ill-appearing.  Skin:    General: Skin is warm and dry.     Findings: Lesion present.     Comments: Verrucous hyperkeratotic growth on left index pulp of distal finger as well as to left thumb under distal nail   Neurological:     Mental Status: He is alert.  Psychiatric:        Mood and Affect: Mood normal.        Behavior: Behavior normal.       Lab Results  Component Value Date   HGBA1C 8.2 04/07/2018   Cryotherapy: Liquid nitrogen was applied for 10-12 seconds to the skin lesion and the expected blistering or scabbing reaction explained. Do not pick at the area. Return if lesion fails to fully resolve.  Assessment & Plan:  This visit occurred during the SARS-CoV-2 public health emergency.  Safety protocols were in place, including screening questions prior to the visit, additional usage of staff PPE, and extensive cleaning of exam room while observing appropriate contact time as indicated for disinfecting solutions.   Problem List Items Addressed This Visit    Verruca vulgaris - Primary    2 separate lesions to left digits (2nd and 1st).  IC obtained and in chart.  Reviewed risks of cryotherapy including possible digit neuropathy given location of warts. Pt desires treatment.  Discussed option of ongoing salicylic acid treatment at home - with duct tape occlusion to increase potency.  Discussed RTC for retreatment if needed, reviewed red flags to indicate infection and need for re evaluation if this happens.       Other Visit Diagnoses    Need for influenza vaccination       Relevant Orders   Flu Vaccine QUAD 36+ mos IM (Completed)       No orders of the defined types were placed in this encounter.  Orders Placed This Encounter  Procedures  . Flu Vaccine QUAD 36+ mos IM    Patient instructions: Flu shot today  Liquid nitrogen applied to 2 warts on fingers  Return for retreatment in 2-3 weeks if not fully resolved. Let us know if any signs of  infection -streaking redness, fever, draining pus.   Follow up plan: Return if symptoms worsen or fail to improve.  Eustaquio Boyden, MD

## 2020-04-03 NOTE — Patient Instructions (Addendum)
Flu shot today  Liquid nitrogen applied to 2 warts on fingers  Return for retreatment in 2-3 weeks if not fully resolved. Let us know if any signs of infection -streaking redness, fever, draining pus.   Warts  Warts are small growths on the skin. They are common and can occur on many areas of the body. A person may have one wart or several warts. In many cases, warts do not require treatment. They usually go away on their own over a period of many months to a few years. If needed, warts that cause problems or do not go away on their own can be treated. What are the causes? Warts are caused by a type of virus that is called human papillomavirus (HPV).  This virus can spread from person to person through direct contact.  Warts can also spread to other areas of the body when a person scratches a wart and then scratches another area of his or her body. What increases the risk? You are more likely to develop this condition if:  You are 72-64 years old.  You have a weakened body defense system (immune system).  You are Caucasian. What are the signs or symptoms? The main symptom of this condition is small growths on the skin. Warts may:  Be round or oval or have an irregular shape.  Have a rough surface.  Range in color from skin color to light yellow, brown, or gray.  Generally be less than  inch (1.3 cm) in size.  Go away and then come back again. Most warts are painless, but some can be painful if they are large or occur in an area of the body where pressure will be applied to them, such as the bottom of the foot. How is this diagnosed? A wart can usually be diagnosed based on its appearance. In some cases, a tissue sample may be removed (biopsy) to be looked at under a microscope. How is this treated? In many cases, warts do not need treatment. Sometimes treatment is desired. If treatment is needed or desired, options may include:  Applying medicated solutions, creams, or  patches to the wart. These may be over-the-counter or prescription medicines that make the skin soft so that layers will gradually shed away. In many cases, the medicine is applied one or two times per day and covered with a bandage.  Putting duct tape over the top of the wart (occlusion). You will leave the tape in place for as long as told by your health care provider and then replace it with a new strip of tape. This is done until the wart goes away.  Freezing the wart with liquid nitrogen (cryotherapy).  Burning the wart with: ? Laser treatment. ? An electrified probe (electrocautery).  Injection of a medicine (Candida antigen) into the wart to help the body's immune system fight off the wart.  Surgery to remove the wart. Follow these instructions at home: Medicines  Apply over-the-counter and prescription medicines only as told by your health care provider.  Do not apply over-the-counter wart medicines to your face or genitals unless your health care provider tells you to do that. Lifestyle  Keep your immune system healthy. To do this: ? Eat a healthy, balanced diet. ? Get enough sleep. ? Do not use any products that contain nicotine or tobacco, such as cigarettes and e-cigarettes. If you need help quitting, ask your health care provider. General instructions   Wash your hands after you touch a wart.  Do not scratch or pick at a wart.  Avoid shaving hair that is over a wart.  Keep all follow-up visits as told by your health care provider. This is important. Contact a health care provider if:  Your warts do not improve after treatment.  You have redness, swelling, or pain at the site of a wart.  You have bleeding from a wart that does not stop with light pressure.  You have diabetes and you develop a wart. Summary  Warts are small growths on the skin. They are common and can occur on many areas of the body.  In many cases, warts do not need treatment. Sometimes  treatment is desired. If treatment is needed or desired, there are several treatment options.  Apply over-the-counter and prescription medicines only as told by your health care provider.  Wash your hands after you touch a wart.  Keep all follow-up visits as told by your health care provider. This is important. This information is not intended to replace advice given to you by your health care provider. Make sure you discuss any questions you have with your health care provider. Document Revised: 11/04/2017 Document Reviewed: 11/04/2017 Elsevier Patient Education  2020 [employer].

## 2020-04-03 NOTE — Assessment & Plan Note (Signed)
2 separate lesions to left digits (2nd and 1st).  IC obtained and in chart.  Reviewed risks of cryotherapy including possible digit neuropathy given location of warts. Pt desires treatment.  Discussed option of ongoing salicylic acid treatment at home - with duct tape occlusion to increase potency.  Discussed RTC for retreatment if needed, reviewed red flags to indicate infection and need for re evaluation if this happens.

## 2020-04-08 ENCOUNTER — Ambulatory Visit
Admission: EM | Admit: 2020-04-08 | Discharge: 2020-04-08 | Disposition: A | Discharge status: Home / Self Care | Payer: BC Managed Care – PPO

## 2020-04-08 ENCOUNTER — Encounter: Payer: Self-pay | Admitting: Emergency Medicine

## 2020-04-08 ENCOUNTER — Other Ambulatory Visit: Payer: Self-pay

## 2020-04-08 DIAGNOSIS — M25562 Pain in left knee: Secondary | ICD-10-CM

## 2020-04-08 DIAGNOSIS — S8992XA Unspecified injury of left lower leg, initial encounter: Secondary | ICD-10-CM

## 2020-04-08 NOTE — Discharge Instructions (Signed)

## 2020-04-08 NOTE — ED Triage Notes (Signed)
Pt sts left knee pain after jumping over wall last night and landing twisting his knee

## 2020-04-08 NOTE — ED Provider Notes (Signed)
[facility]    CSN: [id number] Arrival date & time: 04/08/20  1213      History   Chief Complaint Chief Complaint  Patient presents with  . Knee Pain    HPI Patrick Williams is a 24 y.o. male  Presenting for left knee pain s/p popping sensation that occurred when landing from a jump a ground-level last night.  Patient denies intoxication, head trauma, LOC.  Able to bear weight, though slight pain when doing so.  Has a knee compression sleeve and crutches which she has not yet used.  Denies history of ligamentous tear.  No deformity, direct impact to knee.  Past Medical History:  Diagnosis Date  . Herpes gladiatorum    history  . IDDM (insulin dependent diabetes mellitus) 1999   followed at [facility].  No admissions for overnight stays with hyperglycemia since dx. as of 10/11.  . Wrist fracture, left 2007   resolved w/o deficit    Patient Active Problem List   Diagnosis Date Noted  . Verruca vulgaris 09/27/2014  . Health maintenance examination 03/06/2011  . HERPES SIMPLEX INFECTION 07/24/2009  . Type 1 diabetes mellitus (HCC) 07/14/2007    Past Surgical History:  Procedure Laterality Date  . Hosp. IDDM for low sugar from AGE having already gotten insulin.         Home Medications    Prior to Admission medications   Medication Sig Start Date End Date Taking? Authorizing Provider  ibuprofen (ADVIL,MOTRIN) 200 MG tablet Take 200 mg by mouth as needed.      [provider]  insulin aspart (NOVOLOG FLEXPEN) 100 UNIT/ML FlexPen Inject into the skin 3 (three) times daily with meals. Sliding scale with meals    [provider]  insulin glargine (LANTUS) 100 UNIT/ML injection Inject 36 Units into the skin at bedtime.     [provider]    Family History Family History  Problem Relation Age of Onset  . Healthy Mother   . Healthy Father   . CAD Neg Hx   . Hypertension Neg Hx   . Cancer Neg Hx   . Stroke Neg Hx   . Diabetes Neg Hx      Social History Social History   Tobacco Use  . Smoking status: Never Smoker  . Smokeless tobacco: Never Used  Vaping Use  . Vaping Use: Some days  Substance Use Topics  . Alcohol use: Yes    Comment: social  . Drug use: No     Allergies   Patient has no known allergies.   Review of Systems As per HPI   Physical Exam Triage Vital Signs ED Triage Vitals [04/08/20 1241]  Enc Vitals Group     BP (!) 157/90     Pulse Rate 80     Resp 18     Temp 99 F (37.2 C)     Temp Source Oral     SpO2 97 %     Weight      Height      Head Circumference      Peak Flow      Pain Score 5     Pain Loc      Pain Edu?      Excl. in GC?    No data found.  Updated Vital Signs BP (!) 157/90 (BP Location: Left Arm)   Pulse 80   Temp 99 F (37.2 C) (Oral)   Resp 18   SpO2 97%   Visual  Acuity Right Eye Distance:   Left Eye Distance:   Bilateral Distance:    Right Eye Near:   Left Eye Near:    Bilateral Near:     Physical Exam Constitutional:      General: He is not in acute distress. HENT:     Head: Normocephalic and atraumatic.  Eyes:     General: No scleral icterus.    Pupils: Pupils are equal, round, and reactive to light.  Cardiovascular:     Rate and Rhythm: Normal rate.  Pulmonary:     Effort: Pulmonary effort is normal. No respiratory distress.     Breath sounds: No wheezing.  Musculoskeletal:        General: Swelling and tenderness present. Normal range of motion.     Comments: Mild swelling of lateral aspect of left knee as compared to right.  Negative blotting test.  Low concern for effusion.  Neurovascularly intact.  No bruising, abrasion.  No bony deformity or tenderness.  Patient has mild lateral tenderness worse when loading weight, twisting to left.  Negative anterior drawer test  Skin:    Coloration: Skin is not jaundiced or pale.  Neurological:     Mental Status: He is alert and oriented to person, place, and time.      UC Treatments /  Results  Labs (all labs ordered are listed, but only abnormal results are displayed) Labs Reviewed - No data to display  EKG   Radiology No results found.  Procedures Procedures (including critical care time)  Medications Ordered in UC Medications - No data to display  Initial Impression / Assessment and Plan / UC Course  I have reviewed the triage vital signs and the nursing notes.  Pertinent labs & imaging results that were available during my care of the patient were reviewed by me and considered in my medical decision making (see chart for details).     Low concern for fracture at this time given reassuring exam and relatively low velocity injury.  Patient declines x-ray.  Discussed possible irritation/injury to lateral ligament.  Will wear compression sleeve, use crutches as needed, follow-up with Ortho within the week.  Return precautions discussed, pt verbalized understanding and is agreeable to plan. Final Clinical Impressions(s) / UC Diagnoses   Final diagnoses:  Acute pain of left knee  Injury of left knee, initial encounter     Discharge Instructions     RICE: rest, ice, compression, elevation as needed for pain.    Pain medication:  350 mg-1000 mg of Tylenol (acetaminophen) and/or 200 mg - 800 mg of Advil (ibuprofen, Motrin) every 8 hours as needed.  May alternate between the two throughout the day as they are generally safe to take together.  DO NOT exceed more than 3000 mg of Tylenol or 3200 mg of ibuprofen in a 24 hour period as this could damage your stomach, kidneys, liver, or increase your bleeding risk.  Important to follow up with specialist(s) below for further evaluation/management if your symptoms persist or worsen.    ED Prescriptions    None     PDMP not reviewed this encounter.   Hall-Potvin, [place], [place] 04/08/20 1256

## 2020-10-02 ENCOUNTER — Telehealth: Payer: Self-pay | Admitting: Family Medicine

## 2020-10-02 NOTE — Telephone Encounter (Signed)
Plz schedule cpe and lab visits.  New referral can be discussed then.

## 2020-10-02 NOTE — Telephone Encounter (Signed)
He has a new insurance and needed a referral to a new diabetes doctor. He has [facility] now.  The doctor is under [facility] , [place]: [phone] , address: [address]

## 2020-10-03 NOTE — Telephone Encounter (Signed)
Called patient twice. Unable to reach the patient. Will mail letter EM

## 2020-10-03 NOTE — Telephone Encounter (Signed)
Noted  

## 2020-10-19 ENCOUNTER — Telehealth: Payer: Self-pay | Admitting: Family Medicine

## 2020-10-19 NOTE — Telephone Encounter (Signed)
Pt called in wanted to know about getting a referral to a endocrinologist. Informed him that he may need to be seen by Dr.  Reece Agar first first before putting in the request.  The doctor he is wanting is under [facility] [address], [phone]

## 2020-10-19 NOTE — Telephone Encounter (Signed)
Per Dpr left detailed message for the patient to call back to schedule cpe and labs. EM

## 2020-10-19 NOTE — Telephone Encounter (Signed)
Last CPE, 05/07/19 and no follow ups since.  Plz schedule lab and cpe visits.  Endo referral can be discussed then.

## 2020-12-06 ENCOUNTER — Other Ambulatory Visit: Payer: Self-pay

## 2020-12-06 ENCOUNTER — Ambulatory Visit (INDEPENDENT_AMBULATORY_CARE_PROVIDER_SITE_OTHER): Payer: 59 | Admitting: Family Medicine

## 2020-12-06 ENCOUNTER — Encounter: Payer: Self-pay | Admitting: Family Medicine

## 2020-12-06 VITALS — BP 120/70 | HR 78 | Temp 98.2°F | Ht 70.0 in | Wt 179.6 lb

## 2020-12-06 DIAGNOSIS — Z113 Encounter for screening for infections with a predominantly sexual mode of transmission: Secondary | ICD-10-CM | POA: Diagnosis not present

## 2020-12-06 DIAGNOSIS — Z2839 Other underimmunization status: Secondary | ICD-10-CM

## 2020-12-06 DIAGNOSIS — E109 Type 1 diabetes mellitus without complications: Secondary | ICD-10-CM

## 2020-12-06 DIAGNOSIS — Z0000 Encounter for general adult medical examination without abnormal findings: Secondary | ICD-10-CM

## 2020-12-06 DIAGNOSIS — R21 Rash and other nonspecific skin eruption: Secondary | ICD-10-CM

## 2020-12-06 NOTE — Progress Notes (Signed)
Patient ID: Patrick DodgeRobert Williams, male    DOB: Oct 10, 1995, 25 y.o.   MRN: [id number]  This visit was conducted in person.  BP 120/70   Pulse 78   Temp 98.2 F (36.8 C) (Temporal)   Ht 5\' 10"  (1.778 m)   Wt 179 lb 9 oz (81.4 kg)   SpO2 97%   BMI 25.76 kg/m    CC: CPE  Subjective:   HPI: Patrick DodgeRobert Shepard is a 25 y.o. male presenting on 12/06/2020 for Annual Exam (Wants to discuss endo referral. )   T[facility]DM [facility] ([facility]) - needs new referral due to new insurance. [place] [facility] in [place]. On basaglar and novolog. Last A1c 8.1% (04/20/2020) in CareEverywhere. Trying out Dexcom through endo.   New rash to groin present for several months, improves with antifungal jock itch cream (clotrimazole) but quickly recurs. Not itchy or tender, no drainage, no bleeding. Comes and goes.    Preventative: Flu shot yearly  COVID vaccine - discussed, recommended, declines Tdap 2019  Varicella - only records of 1 vaccine  Pneumovax 2017 Seat belt use discussed Sunscreen use discussed. No changing moles on skin.  Smoking - none  Alcohol - social 1-2 beers/wk Rec drugs - none  Currently sexually active - 3 partners in past year, now with monogamous GF. 100% protection  Dentist q6 mo Eye exam yearly - due   "[name]" Lives with Mother, younger sister and Step-Dad, 10 dogs, 3 cats, 1 pig BioFather in [place]: HS [employer], [employer] studying kinesiology. Wrestler.  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  Moved to [place] at age 89      Relevant past medical, surgical, family and social history reviewed and updated as indicated. Interim medical history since our last visit reviewed. Allergies and medications reviewed and updated. Outpatient Medications Prior to Visit  Medication Sig Dispense Refill   Continuous Blood Gluc Sensor (DEXCOM G6 SENSOR) MISC Inject 1 sensor to the skin  every 10 days for continuous glucose monitoring.     Continuous Blood Gluc Transmit (DEXCOM G6 TRANSMITTER) MISC Use as directed for continuous glucose monitoring. Reuse transmitter for 90 days then discard and replace.     ibuprofen (ADVIL,MOTRIN) 200 MG tablet Take 200 mg by mouth as needed.     insulin aspart (NOVOLOG) 100 UNIT/ML FlexPen Inject into the skin 3 (three) times daily with meals. Sliding scale with meals     Insulin Glargine (BASAGLAR KWIKPEN) 100 UNIT/ML Inject into the skin.     Insulin Glargine (BASAGLAR KWIKPEN) 100 UNIT/ML Inject 35 Units into the skin at bedtime.     insulin glargine (LANTUS) 100 UNIT/ML injection Inject 36 Units into the skin at bedtime.      No facility-administered medications prior to visit.     Per HPI unless specifically indicated in ROS section below Review of Systems  Constitutional:  Negative for activity change, appetite change, chills, fatigue, fever and unexpected weight change.  HENT:  Negative for hearing loss.   Eyes:  Negative for visual disturbance.  Respiratory:  Negative for cough, chest tightness, shortness of breath and wheezing.   Cardiovascular:  Negative for chest pain, palpitations and leg swelling.  Gastrointestinal:  Negative for abdominal distention, abdominal pain, blood in stool, constipation, diarrhea, nausea and vomiting.  Genitourinary:  Negative for difficulty urinating and hematuria.  Musculoskeletal:  Negative for arthralgias, myalgias and neck pain.  Skin:  Negative for rash.  Neurological:  Negative for dizziness, seizures, syncope and headaches.  Hematological:  Negative for adenopathy. Does not bruise/bleed easily.  Psychiatric/Behavioral:  Negative for dysphoric mood. The patient is not nervous/anxious.   Objective:  BP 120/70   Pulse 78   Temp 98.2 F (36.8 C) (Temporal)   Ht 5\' 10"  (1.778 m)   Wt 179 lb 9 oz (81.4 kg)   SpO2 97%   BMI 25.76 kg/m   Wt Readings from Last 3 Encounters:  12/06/20 179 lb  9 oz (81.4 kg)  04/03/20 179 lb 9 oz (81.4 kg)  05/07/19 174 lb 9 oz (79.2 kg)      Physical Exam Vitals and nursing note reviewed.  Constitutional:      General: He is not in acute distress.    Appearance: Normal appearance. He is well-developed. He is not ill-appearing.  HENT:     Head: Normocephalic and atraumatic.     Right Ear: Hearing, tympanic membrane, ear canal and external ear normal.     Left Ear: Hearing, tympanic membrane, ear canal and external ear normal.  Eyes:     General: No scleral icterus.    Extraocular Movements: Extraocular movements intact.     Conjunctiva/sclera: Conjunctivae normal.     Pupils: Pupils are equal, round, and reactive to light.  Neck:     Thyroid: No thyroid mass or thyromegaly.  Cardiovascular:     Rate and Rhythm: Normal rate and regular rhythm.     Pulses: Normal pulses.          Radial pulses are 2+ on the right side and 2+ on the left side.     Heart sounds: Normal heart sounds. No murmur heard. Pulmonary:     Effort: Pulmonary effort is normal. No respiratory distress.     Breath sounds: Normal breath sounds. No wheezing, rhonchi or rales.  Abdominal:     General: Bowel sounds are normal. There is no distension.     Palpations: Abdomen is soft. There is no mass.     Tenderness: There is no abdominal tenderness. There is no guarding or rebound.     Hernia: No hernia is present. There is no hernia in the left inguinal area or right inguinal area.  Genitourinary:    Pubic Area: Rash present.     Penis: Normal.      Testes: Normal.        Right: Mass, tenderness or swelling not present.        Left: Mass, tenderness or swelling not present.     Epididymis:     Right: Normal.     Left: Normal.     Comments: Erythematous rash to base of penis as well as some into shaft, no papules or other lesions Musculoskeletal:        General: Normal range of motion.     Cervical back: Normal range of motion and neck supple.     Right lower  leg: No edema.     Left lower leg: No edema.  Lymphadenopathy:     Cervical: No cervical adenopathy.     Lower Body: No right inguinal adenopathy. No left inguinal adenopathy.  Skin:    General: Skin is warm and dry.     Findings: No rash.  Neurological:     General: No focal deficit present.     Mental Status: He is alert and oriented to person, place, and time.     Comments: CN grossly intact, station  and gait intact  Psychiatric:        Mood and Affect: Mood normal.        Behavior: Behavior normal.        Thought Content: Thought content normal.        Judgment: Judgment normal.      Results for orders placed or performed in visit on 12/06/20  Varicella zoster antibody, IgG  Result Value Ref Range   Varicella IgG 1,641.00 index  HIV Antibody (routine testing w rflx)  Result Value Ref Range   HIV 1&2 Ab, 4th Generation NON-REACTIVE NON-REACTIVE   Depression screen [facility] 2/9 12/06/2020 05/07/2019  Decreased Interest 0 0  Down, Depressed, Hopeless 0 0  PHQ - 2 Score 0 0    Assessment & Plan:  This visit occurred during the SARS-CoV-2 public health emergency.  Safety protocols were in place, including screening questions prior to the visit, additional usage of staff PPE, and extensive cleaning of exam room while observing appropriate contact time as indicated for disinfecting solutions.   Problem List Items Addressed This Visit     Type 1 diabetes mellitus (HCC)    Chronic, stable period. Needs new endo referral due to insurance change - referral placed. Now using DexCom. Update  labs.        Relevant Medications   Insulin Glargine (BASAGLAR KWIKPEN) 100 UNIT/ML   Other Relevant Orders   Ambulatory referral to Endocrinology   Lipid panel   Comprehensive metabolic panel   TSH   Hemoglobin A1c   Microalbumin / creatinine urine ratio   Health maintenance examination - Primary    Preventative protocols reviewed and updated unless pt declined. Discussed healthy diet and  lifestyle.        Skin rash    Skin rash to groin present for months. Not itchy or tender. Not consistent with HSV or genital warts or psoriasis. ?eczema vs tinea - does endorse skin can get dry and cracked/tender, also notes increased perspiration worsens rash. No persistent improvement with lotrimin. Check LFTs, consider oral antifungal vs topical steroid.        Immunity to varicella determined by serologic test    Seems has only received 1 dose of varicella - check titers today.  Wonders if he's had chicken pox before.        Other Visit Diagnoses     Screen for STD (sexually transmitted disease)       Relevant Orders   HIV Antibody (routine testing w rflx) (Completed)   RPR   C. trachomatis/N. gonorrhoeae RNA        No orders of the defined types were placed in this encounter.  Orders Placed This Encounter  Procedures   C. trachomatis/N. gonorrhoeae RNA   Lipid panel   Comprehensive metabolic panel   TSH   Hemoglobin A1c   Varicella zoster antibody, IgG   HIV Antibody (routine testing w rflx)   RPR   Microalbumin / creatinine urine ratio   Ambulatory referral to Endocrinology    Referral Priority:   Routine    Referral Type:   Consultation    Referral Reason:   Specialty Services Required    Number of Visits Requested:   1    Patient instructions; I do recommend covid vaccines in diabetes history - look into this.  Schedule eye exam if you're due.  Labs today  We will be in touch with results and plan for groin rash.   Follow up plan: Return in about 1  year (around 12/06/2021), or if symptoms worsen or fail to improve, for annual exam, prior fasting for blood work.  Eustaquio Boyden, MD

## 2020-12-06 NOTE — Patient Instructions (Addendum)
I do recommend covid vaccines in diabetes history - look into this.  Schedule eye exam if you're due.  Labs today  We will be in touch with results and plan for groin rash.   Health Maintenance, Male Adopting a healthy lifestyle and getting preventive care are important in promoting health and wellness. Ask your health care provider about:  The right schedule for you to have regular tests and exams.  Things you can do on your own to prevent diseases and keep yourself healthy. What should I know about diet, weight, and exercise? Eat a healthy diet  Eat a diet that includes plenty of vegetables, fruits, low-fat dairy products, and lean protein.  Do not eat a lot of foods that are high in solid fats, added sugars, or sodium.   Maintain a healthy weight Body mass index (BMI) is a measurement that can be used to identify possible weight problems. It estimates body fat based on height and weight. Your health care provider can help determine your BMI and help you achieve or maintain a healthy weight. Get regular exercise Get regular exercise. This is one of the most important things you can do for your health. Most adults should:  Exercise for at least 150 minutes each week. The exercise should increase your heart rate and make you sweat (moderate-intensity exercise).  Do strengthening exercises at least twice a week. This is in addition to the moderate-intensity exercise.  Spend less time sitting. Even light physical activity can be beneficial. Watch cholesterol and blood lipids Have your blood tested for lipids and cholesterol at 26 years of age, then have this test every 5 years. You may need to have your cholesterol levels checked more often if:  Your lipid or cholesterol levels are high.  You are older than 26 years of age.  You are at high risk for heart disease. What should I know about cancer screening? Many types of cancers can be detected early and may often be prevented.  Depending on your health history and family history, you may need to have cancer screening at various ages. This may include screening for:  Colorectal cancer.  Prostate cancer.  Skin cancer.  Lung cancer. What should I know about heart disease, diabetes, and high blood pressure? Blood pressure and heart disease  High blood pressure causes heart disease and increases the risk of stroke. This is more likely to develop in people who have high blood pressure readings, are of African descent, or are overweight.  Talk with your health care provider about your target blood pressure readings.  Have your blood pressure checked: ? Every 3-5 years if you are 14-78 years of age. ? Every year if you are 36 years old or older.  If you are between the ages of 27 and 44 and are a current or former smoker, ask your health care provider if you should have a one-time screening for abdominal aortic aneurysm (AAA). Diabetes Have regular diabetes screenings. This checks your fasting blood sugar level. Have the screening done:  Once every three years after age 55 if you are at a normal weight and have a low risk for diabetes.  More often and at a younger age if you are overweight or have a high risk for diabetes. What should I know about preventing infection? Hepatitis B If you have a higher risk for hepatitis B, you should be screened for this virus. Talk with your health care provider to find out if you are at risk  for hepatitis B infection. Hepatitis C Blood testing is recommended for:  Everyone born from [birth year] through 1966.  Anyone with known risk factors for hepatitis C. Sexually transmitted infections (STIs)  You should be screened each year for STIs, including gonorrhea and chlamydia, if: ? You are sexually active and are younger than 26 years of age. ? You are older than 26 years of age and your health care provider tells you that you are at risk for this type of infection. ? Your sexual  activity has changed since you were last screened, and you are at increased risk for chlamydia or gonorrhea. Ask your health care provider if you are at risk.  Ask your health care provider about whether you are at high risk for HIV. Your health care provider may recommend a prescription medicine to help prevent HIV infection. If you choose to take medicine to prevent HIV, you should first get tested for HIV. You should then be tested every 3 months for as long as you are taking the medicine. Follow these instructions at home: Lifestyle  Do not use any products that contain nicotine or tobacco, such as cigarettes, e-cigarettes, and chewing tobacco. If you need help quitting, ask your health care provider.  Do not use street drugs.  Do not share needles.  Ask your health care provider for help if you need support or information about quitting drugs. Alcohol use  Do not drink alcohol if your health care provider tells you not to drink.  If you drink alcohol: ? Limit how much you have to 0-2 drinks a day. ? Be aware of how much alcohol is in your drink. In the [place] one drink equals one 12 oz bottle of beer (355 mL), one 5 oz glass of wine (148 mL), or one 1 oz glass of hard liquor (44 mL). General instructions  Schedule regular health, dental, and eye exams.  Stay current with your vaccines.  Tell your health care provider if: ? You often feel depressed. ? You have ever been abused or do not feel safe at home. Summary  Adopting a healthy lifestyle and getting preventive care are important in promoting health and wellness.  Follow your health care provider's instructions about healthy diet, exercising, and getting tested or screened for diseases.  Follow your health care provider's instructions on monitoring your cholesterol and blood pressure. This information is not intended to replace advice given to you by your health care provider. Make sure you discuss any questions you have  with your health care provider. Document Revised: 06/10/2018 Document Reviewed: 06/10/2018 Elsevier Patient Education  2021 [employer].

## 2020-12-07 DIAGNOSIS — Z0184 Encounter for antibody response examination: Secondary | ICD-10-CM | POA: Insufficient documentation

## 2020-12-07 DIAGNOSIS — R21 Rash and other nonspecific skin eruption: Secondary | ICD-10-CM | POA: Insufficient documentation

## 2020-12-07 LAB — MICROALBUMIN / CREATININE URINE RATIO
Creatinine,U: 72.3 mg/dL
Microalb Creat Ratio: 1 mg/g (ref 0.0–30.0)
Microalb, Ur: <0.7 mg/dL (ref 0.0–1.9)

## 2020-12-07 LAB — COMPREHENSIVE METABOLIC PANEL
ALT: 12 U/L (ref 0–53)
AST: 15 U/L (ref 0–37)
Albumin: 4.5 g/dL (ref 3.5–5.2)
Alkaline Phosphatase: 63 U/L (ref 39–117)
BUN: 13 mg/dL (ref 6–23)
CO2: 31 mEq/L (ref 19–32)
Calcium: 9.4 mg/dL (ref 8.4–10.5)
Chloride: 101 mEq/L (ref 96–112)
Creatinine, Ser: 1.16 mg/dL (ref 0.40–1.50)
GFR: 87.55 mL/min (ref >60.00)
Glucose, Bld: 235 mg/dL — ABNORMAL HIGH (ref 70–99)
Potassium: 4.4 mEq/L (ref 3.5–5.1)
Sodium: 139 mEq/L (ref 135–145)
Total Bilirubin: 0.5 mg/dL (ref 0.2–1.2)
Total Protein: 7 g/dL (ref 6.0–8.3)

## 2020-12-07 LAB — VARICELLA ZOSTER ANTIBODY, IGG: Varicella IgG: 1641 index

## 2020-12-07 LAB — LIPID PANEL
Cholesterol: 153 mg/dL (ref 0–200)
HDL: 49.7 mg/dL (ref >39.00)
LDL Cholesterol: 82 mg/dL (ref 0–99)
NonHDL: 103.61
Total CHOL/HDL Ratio: 3
Triglycerides: 107 mg/dL (ref 0.0–149.0)
VLDL: 21.4 mg/dL (ref 0.0–40.0)

## 2020-12-07 LAB — RPR: RPR Ser Ql: NONREACTIVE

## 2020-12-07 LAB — C. TRACHOMATIS/N. GONORRHOEAE RNA
C. trachomatis RNA, TMA: NOT DETECTED
N. gonorrhoeae RNA, TMA: NOT DETECTED

## 2020-12-07 LAB — HIV ANTIBODY (ROUTINE TESTING W REFLEX): HIV 1&2 Ab, 4th Generation: NONREACTIVE

## 2020-12-07 LAB — TSH: TSH: 1.23 u[IU]/mL (ref 0.35–4.50)

## 2020-12-07 LAB — HEMOGLOBIN A1C: Hgb A1c MFr Bld: 8.1 % — ABNORMAL HIGH (ref 4.6–6.5)

## 2020-12-07 NOTE — Assessment & Plan Note (Signed)
Preventative protocols reviewed and updated unless pt declined. Discussed healthy diet and lifestyle.  

## 2020-12-07 NOTE — Assessment & Plan Note (Addendum)
Skin rash to groin present for months. Not itchy or tender. Not consistent with HSV or genital warts or psoriasis. ?eczema vs tinea - does endorse skin can get dry and cracked/tender, also notes increased perspiration worsens rash. No persistent improvement with lotrimin. Check LFTs, consider oral antifungal vs topical steroid.

## 2020-12-07 NOTE — Assessment & Plan Note (Signed)
Chronic, stable period. Needs new endo referral due to insurance change - referral placed. Now using DexCom. Update  labs.

## 2020-12-07 NOTE — Assessment & Plan Note (Addendum)
Seems has only received 1 dose of varicella - check titers today.  Wonders if he's had chicken pox before.

## 2020-12-10 ENCOUNTER — Other Ambulatory Visit: Payer: Self-pay | Admitting: Family Medicine

## 2020-12-10 MED ORDER — FLUCONAZOLE 150 MG PO TABS: 150.0000 mg | ORAL_TABLET | ORAL | 0 refills | Status: DC

## 2023-05-26 ENCOUNTER — Telehealth: Payer: Self-pay | Admitting: Family Medicine

## 2023-05-26 NOTE — Telephone Encounter (Signed)
Nice Type 1 diabetic patient ok to schedule f/u with me. He was previously on basal Basaglar 35u daily and novolog flexpen TID with meals.  What is his current insulin dosing?  This usually comes from endo - is he still seeing endocrinologist?   Per last endo note 2022 Dr Shawnee Knapp: Lower Basaglar Kwikpen from 35 to 30 units at bedtime. Continue Novolog Flexpen 1 unit per 12 grams carbohydrates plus sliding scale (1 unit per glucose 50 above 150).

## 2023-05-26 NOTE — Telephone Encounter (Signed)
Lvm asking pt to call back. Need to relay Dr Timoteo Expose message and schedule f/u OV.

## 2023-05-26 NOTE — Telephone Encounter (Signed)
Pt called in and stated that he would like to come back as a pt of Dr.G he also stated that he only has one day of insulin left

## 2023-05-27 NOTE — Telephone Encounter (Signed)
Lvm asking pt to call back. Need to relay Dr Timoteo Expose message and schedule f/u OV.

## 2023-05-28 NOTE — Telephone Encounter (Signed)
Lvm asking pt to call back. Need to relay Dr Timoteo Expose message and schedule f/u OV.

## 2023-05-28 NOTE — Telephone Encounter (Signed)
Called patient cell left message. Home number listed is dad's number requested he ask patient to return call to our office.

## 2023-06-02 NOTE — Telephone Encounter (Signed)
 Lvm asking pt to call back. Need to relay Dr Timoteo Expose message and schedule f/u OV.

## 2023-06-03 NOTE — Telephone Encounter (Signed)
 Lvm asking pt to call back. Need to relay Dr Timoteo Expose message and schedule f/u OV.

## 2023-06-04 NOTE — Telephone Encounter (Addendum)
Lvm asking pt to call back. Need to relay Dr Timoteo Expose message and schedule f/u OV. Mailing a letter.

## 2023-07-29 ENCOUNTER — Ambulatory Visit: Payer: No Typology Code available for payment source | Admitting: Family Medicine

## 2023-07-29 ENCOUNTER — Encounter: Payer: Self-pay | Admitting: Family Medicine

## 2023-07-29 VITALS — BP 136/80 | HR 98 | Temp 98.5°F | Ht 70.0 in | Wt 174.1 lb

## 2023-07-29 DIAGNOSIS — Z23 Encounter for immunization: Secondary | ICD-10-CM

## 2023-07-29 DIAGNOSIS — Z8249 Family history of ischemic heart disease and other diseases of the circulatory system: Secondary | ICD-10-CM | POA: Diagnosis not present

## 2023-07-29 DIAGNOSIS — E109 Type 1 diabetes mellitus without complications: Secondary | ICD-10-CM | POA: Diagnosis not present

## 2023-07-29 LAB — POCT GLYCOSYLATED HEMOGLOBIN (HGB A1C): Hemoglobin A1C: 7.1 % — AB (ref 4.0–5.6)

## 2023-07-29 MED ORDER — INSULIN ASPART 100 UNIT/ML FLEXPEN: PEN_INJECTOR | SUBCUTANEOUS | 3 refills | Status: DC

## 2023-07-29 MED ORDER — FREESTYLE LIBRE 3 SENSOR MISC: 3 refills | Status: AC

## 2023-07-29 MED ORDER — GVOKE HYPOPEN 2-PACK 1 MG/0.2ML ~~LOC~~ SOAJ: 1.0000 mg | SUBCUTANEOUS | 2 refills | Status: AC | PRN

## 2023-07-29 MED ORDER — INSULIN GLARGINE 100 UNIT/ML SOLOSTAR PEN: 30.0000 [IU] | PEN_INJECTOR | Freq: Every day | SUBCUTANEOUS | 3 refills | Status: DC

## 2023-07-29 NOTE — Progress Notes (Unsigned)
Ph: [phone] Fax: [phone]   Patient ID: Patrick Williams, male    DOB: Apr 12, 1996, 28 y.o.   MRN: [id number]  This visit was conducted in person.  BP 136/80   Pulse 98   Temp 98.5 F (36.9 C) (Oral)   Ht 5\' 10"  (1.778 m)   Wt 174 lb 2 oz (79 kg)   SpO2 97%   BMI 24.98 kg/m    CC: re establish care  Subjective:   HPI: Patrick Williams is a 27 y.o. male presenting on 07/29/2023 for Medical Management of Chronic Issues (Here for DM f/u. Requests new endo referral. )   Last seen 11/2020.  Type 1 diabetic previously saw [facility] endocrinology Meribeth Mattes PA, last seen 2020. Briefly saw [facility] endo 2022. Needs new local endocrinologist.  Diagnosed: 1999  Continues basal insulin 30u - finds lantus works better than basaglar.  Also on regular insulin sliding scale - 1:12 ratio, with SSI 1 unit every 50 over 150. Averaging ~25 units Novolog per day  Has been using glucometer premiere blue relion. Doesn't currently have CGM.  Occ low sugars - latest 5-6 days ago 46 overnight - treated with juice.  No hyperglycemia.  Lab Results  Component Value Date   HGBA1C 7.1 (A) 07/29/2023   Diabetic Foot Exam - Simple   No data filed    Lab Results  Component Value Date   MICROALBUR <0.7 12/06/2020   MICROALBUR done 02/22/2016  Will need updated at next visit.   Working for [employer] in [employer] [place].  Biological father passed away last year from stroke age 46yo - heavy drinker.      Relevant past medical, surgical, family and social history reviewed and updated as indicated. Interim medical history since our last visit reviewed. Allergies and medications reviewed and updated. Outpatient Medications Prior to Visit  Medication Sig Dispense Refill   ibuprofen (ADVIL,MOTRIN) 200 MG tablet Take 200 mg by mouth as needed.     insulin aspart (NOVOLOG) 100 UNIT/ML FlexPen Inject into the skin 3 (three) times daily with meals. Sliding scale with meals      insulin glargine (LANTUS) 100 UNIT/ML Solostar Pen Inject 30 Units into the skin at bedtime.     Continuous Blood Gluc Sensor (DEXCOM G6 SENSOR) MISC Inject 1 sensor to the skin every 10 days for continuous glucose monitoring.     Continuous Blood Gluc Transmit (DEXCOM G6 TRANSMITTER) MISC Use as directed for continuous glucose monitoring. Reuse transmitter for 90 days then discard and replace.     fluconazole (DIFLUCAN) 150 MG tablet Take 1 tablet (150 mg total) by mouth once a week. 2 tablet 0   Insulin Glargine (BASAGLAR KWIKPEN) 100 UNIT/ML Inject 35 Units into the skin at bedtime.     No facility-administered medications prior to visit.     Per HPI unless specifically indicated in ROS section below Review of Systems  Objective:  BP 136/80   Pulse 98   Temp 98.5 F (36.9 C) (Oral)   Ht 5\' 10"  (1.778 m)   Wt 174 lb 2 oz (79 kg)   SpO2 97%   BMI 24.98 kg/m   Wt Readings from Last 3 Encounters:  07/29/23 174 lb 2 oz (79 kg)  12/06/20 179 lb 9 oz (81.4 kg)  04/03/20 179 lb 9 oz (81.4 kg)      Physical Exam Vitals and nursing note reviewed.  Constitutional:      Appearance: Normal appearance. He is not ill-appearing.  HENT:     Head: Normocephalic and atraumatic.     Mouth/Throat:     Mouth: Mucous membranes are moist.     Pharynx: Oropharynx is clear. No oropharyngeal exudate or posterior oropharyngeal erythema.  Eyes:     Extraocular Movements: Extraocular movements intact.     Conjunctiva/sclera: Conjunctivae normal.     Pupils: Pupils are equal, round, and reactive to light.  Neck:     Thyroid: No thyroid mass or thyromegaly.  Cardiovascular:     Rate and Rhythm: Normal rate and regular rhythm.     Pulses: Normal pulses.     Heart sounds: Normal heart sounds. No murmur heard. Pulmonary:     Effort: Pulmonary effort is normal. No respiratory distress.     Breath sounds: Normal breath sounds. No wheezing, rhonchi or rales.  Musculoskeletal:     Cervical back:  Normal range of motion and neck supple.     Right lower leg: No edema.     Left lower leg: No edema.  Skin:    General: Skin is warm and dry.     Findings: No rash.  Neurological:     Mental Status: He is alert.  Psychiatric:        Mood and Affect: Mood normal.        Behavior: Behavior normal.       Results for orders placed or performed in visit on 07/29/23  POCT glycosylated hemoglobin (Hb A1C)   Collection Time: 07/29/23  3:26 PM  Result Value Ref Range   Hemoglobin A1C 7.1 (A) 4.0 - 5.6 %   HbA1c POC (<> result, manual entry)     HbA1c, POC (prediabetic range)     HbA1c, POC (controlled diabetic range)      Assessment & Plan:   Problem List Items Addressed This Visit     Type 1 diabetes mellitus (HCC) - Primary   Pleasant T1DM on basal/bolus insulin, needs to establish with new endocrinologist locally - referral to [facility] placed.  Rx Lantus basal insulin 30u Rx Novolog Regular insulin 1:12 carb ratio + SSI 1u for every 50 over 150.  Freestyle Libre 3 CGM Rx to pharmacy. Discussed difference between CGM sugar readings and fingerstick sugar readings.  Notes occasional hypoglycemia - 15:15 hypoglycemia rule info provided, Rx G-Voke glucagon pens.  I did ask him to return in summer 2025 for CPE. Will check FLP and Lp(a) (given new fmhx) at that time       Relevant Medications   insulin aspart (NOVOLOG) 100 UNIT/ML FlexPen   insulin glargine (LANTUS) 100 UNIT/ML Solostar Pen   Glucagon (GVOKE HYPOPEN 2-PACK) 1 MG/0.2ML SOAJ   Other Relevant Orders   POCT glycosylated hemoglobin (Hb A1C) (Completed)   Ambulatory referral to Endocrinology   Family history of premature CAD   Other Visit Diagnoses       Encounter for immunization       Relevant Orders   Flu vaccine trivalent PF, 6mos and older(Flulaval,Afluria,Fluarix,Fluzone) (Completed)        Meds ordered this encounter  Medications   insulin aspart (NOVOLOG) 100 UNIT/ML FlexPen    Sig: Sliding scale with  meals 1 unit for every 50 over 150    Dispense:  15 mL    Refill:  3   insulin glargine (LANTUS) 100 UNIT/ML Solostar Pen    Sig: Inject 30 Units into the skin at bedtime.    Dispense:  15 mL    Refill:  3   Continuous  Glucose Sensor (FREESTYLE LIBRE 3 SENSOR) MISC    Sig: Place 1 sensor on the skin every 14 days. Use to check glucose continuously    Dispense:  6 each    Refill:  3   Glucagon (GVOKE HYPOPEN 2-PACK) 1 MG/0.2ML SOAJ    Sig: Inject 1 mg into the skin as needed (for symptomatic hypoglycemia).    Dispense:  0.4 mL    Refill:  2    Orders Placed This Encounter  Procedures   Flu vaccine trivalent PF, 6mos and older(Flulaval,Afluria,Fluarix,Fluzone)   Ambulatory referral to Endocrinology    Referral Priority:   Routine    Referral Type:   Consultation    Referral Reason:   Specialty Services Required    Number of Visits Requested:   1   POCT glycosylated hemoglobin (Hb A1C)    Patient Instructions  Flu shot today  Lantus and novolog refilled today Price out freestyle libre 3 Gvoke glucagon pen provided today - google discount coupon for possible $25 cost.  We will refer you to [facility] endocrinology in [place]  Call eye doctor for diabetic eye exam.  Return in 3-6 months for physical   The 15-15 rule for low sugars: If sugar reading below 70, have 15 grams of carbohydrate to raise your blood sugar and check it after 15 minutes. If it's still below 70 mg/dL, have another serving. 15 grams of carbs may be: -Glucose tablets (see instructions) -Gel tube (see instructions) -4 ounces (1/2 cup) of juice or regular soda (not diet) -1 tablespoon of sugar, honey, or corn syrup -Hard candies, jellybeans or gumdrops--see food label for how many to consume  Repeat these steps until your blood sugar is at least 70 mg/dL. Once your blood sugar is back to normal, eat a meal or snack to make sure it doesn't lower again.    Follow up plan: Return in about 6 months (around  01/26/2024) for annual exam, prior fasting for blood work.  Eustaquio Boyden, MD

## 2023-07-29 NOTE — Patient Instructions (Addendum)
Flu shot today  Lantus and novolog refilled today Price out freestyle libre 3 Gvoke glucagon pen provided today - google discount coupon for possible $25 cost.  We will refer you to [facility] endocrinology in [place]  Call eye doctor for diabetic eye exam.  Return in 3-6 months for physical   The 15-15 rule for low sugars: If sugar reading below 70, have 15 grams of carbohydrate to raise your blood sugar and check it after 15 minutes. If it's still below 70 mg/dL, have another serving. 15 grams of carbs may be: -Glucose tablets (see instructions) -Gel tube (see instructions) -4 ounces (1/2 cup) of juice or regular soda (not diet) -1 tablespoon of sugar, honey, or corn syrup -Hard candies, jellybeans or gumdrops--see food label for how many to consume  Repeat these steps until your blood sugar is at least 70 mg/dL. Once your blood sugar is back to normal, eat a meal or snack to make sure it doesn't lower again.

## 2023-07-31 ENCOUNTER — Encounter: Payer: Self-pay | Admitting: Family Medicine

## 2023-07-31 DIAGNOSIS — Z8249 Family history of ischemic heart disease and other diseases of the circulatory system: Secondary | ICD-10-CM | POA: Insufficient documentation

## 2023-07-31 NOTE — Assessment & Plan Note (Addendum)
Pleasant T1DM on basal/bolus insulin, needs to establish with new endocrinologist locally - referral to [facility]o placed.  Rx Lantus basal insulin 30u Rx Novolog Regular insulin 1:12 carb ratio + SSI 1u for every 50 over 150.  Freestyle Libre 3 CGM Rx to pharmacy. Discussed difference between CGM sugar readings and fingerstick sugar readings.  Notes occasional hypoglycemia - 15:15 hypoglycemia rule info provided, Rx G-Voke glucagon pens.  I did ask him to return in summer 2025 for CPE. Will check FLP and Lp(a) (given new fmhx) at that time

## 2023-08-14 ENCOUNTER — Other Ambulatory Visit: Payer: Self-pay | Admitting: Family Medicine

## 2023-08-14 DIAGNOSIS — E109 Type 1 diabetes mellitus without complications: Secondary | ICD-10-CM

## 2023-08-14 NOTE — Telephone Encounter (Signed)
Please see attached note regarding information needed by pharmacy.

## 2023-08-14 NOTE — Telephone Encounter (Signed)
Copied from CRM [id number]. Topic: Clinical - Medication Refill >> Aug 14, 2023 11:40 AM Eunice Blase wrote: Most Recent Primary Care Visit:  Provider: Eustaquio Boyden  Department: [facility]  Visit Type: OFFICE VISIT  Date: 07/29/2023  Medication: insulin aspart (NOVOLOG) 100 UNIT/ML FlexPen  Has the patient contacted their pharmacy? Yes (Agent: If no, request that the patient contact the pharmacy for the refill. If patient does not wish to contact the pharmacy document the reason why and proceed with request.) (Agent: If yes, when and what did the pharmacy advise?)Pharmacy need changes made: Max units per day for billing insurance, per [facility].  Is this the correct pharmacy for this prescription? Yes If no, delete pharmacy and type the correct one.  This is the patient's preferred pharmacy:  [facility] [address] ([address]), [place] - [address] [place] (SE) [place] [address] Phone: [phone] Fax: [phone]   Has the prescription been filled recently? Yes  Is the patient out of the medication? Yes  Has the patient been seen for an appointment in the last year OR does the patient have an upcoming appointment? Yes  Can we respond through MyChart? Yes  Agent: Please be advised that Rx refills may take up to 3 business days. We ask that you follow-up with your pharmacy.

## 2023-08-15 MED ORDER — INSULIN ASPART 100 UNIT/ML FLEXPEN: PEN_INJECTOR | SUBCUTANEOUS | 3 refills | Status: AC

## 2023-08-15 NOTE — Telephone Encounter (Signed)
ERx

## 2023-09-02 ENCOUNTER — Telehealth: Payer: Self-pay | Admitting: Pharmacy Technician

## 2023-09-02 ENCOUNTER — Other Ambulatory Visit (HOSPITAL_COMMUNITY): Payer: Self-pay

## 2023-09-02 MED ORDER — INSULIN LISPRO (1 UNIT DIAL) 100 UNIT/ML (KWIKPEN): PEN_INJECTOR | SUBCUTANEOUS | 3 refills | Status: DC

## 2023-09-02 NOTE — Telephone Encounter (Signed)
 Please notify pt of below - I've changed insulin from Novolog flexpen to Humalog Flexpen due to preferred insurance formulary.  Please let us know if any trouble filling this.   Has he head from endo about scheduling appt? If not plz have referral team follow up with endo office.

## 2023-09-02 NOTE — Telephone Encounter (Signed)
 Pharmacy Patient Advocate Encounter   Received notification from CoverMyMeds that prior authorization for NOVOLOG 100U/ML FLEXPEN is required/requested.   Insurance verification completed.   The patient is insured through  [facility] [employer]  .   Per test claim:  HUMALOG 100U/ML KWIKPEN is preferred by the insurance.  If suggested medication is appropriate, Please send in a new RX and discontinue this one. If not, please advise as to why it's not appropriate so that we may request a Prior Authorization. Please note, some preferred medications may still require a PA.  If the suggested medications have not been trialed and there are no contraindications to their use, the PA will not be submitted, as it will not be approved.

## 2023-09-03 ENCOUNTER — Other Ambulatory Visit (HOSPITAL_COMMUNITY): Payer: Self-pay

## 2023-09-03 NOTE — Telephone Encounter (Signed)
 Lvm asking pt to call back.  Need to relay Dr. Timoteo Expose message and get answer to his question.

## 2023-09-04 ENCOUNTER — Telehealth: Payer: Self-pay

## 2023-09-05 ENCOUNTER — Other Ambulatory Visit (HOSPITAL_COMMUNITY): Payer: Self-pay

## 2023-09-05 NOTE — Telephone Encounter (Signed)
 Lvm asking pt to call back.  Need to relay Dr. Timoteo Expose message and get answer to his question.

## 2023-09-05 NOTE — Telephone Encounter (Signed)
 A user error has taken place: encounter opened in error, closed for administrative reasons.

## 2023-09-08 NOTE — Telephone Encounter (Addendum)
 Lvm asking pt to call back.  Need to relay Dr. Timoteo Expose message and get answer to his question.  Mailing a letter.

## 2023-11-12 ENCOUNTER — Ambulatory Visit: Admitting: "Endocrinology

## 2024-01-26 ENCOUNTER — Encounter: Payer: Self-pay | Admitting: Family Medicine

## 2024-01-26 ENCOUNTER — Encounter: Payer: No Typology Code available for payment source | Admitting: Family Medicine

## 2024-01-26 NOTE — Progress Notes (Deleted)
 Ph: [phone] Fax: [phone]   Patient ID: Patrick Williams, male    DOB: 1996-06-16, 28 y.o.   MRN: [id number]  This visit was conducted in person.  There were no vitals taken for this visit.   CC: CPE Subjective:   HPI: Patrick Williams is a 28 y.o. male presenting on 01/26/2024 for No chief complaint on file.   T1DM followed by [facility] endo (PA Norleen Roughen) - until change needed due to insurance. Referred to [facility] endo but missed apt 11/12/2023. On lantus  and novolog  --> humalog  due to insurance.  Continues CGM Freestyle Libre 3 plus Lab Results  Component Value Date   HGBA1C 7.1 (A) 07/29/2023     Preventative: Flu shot yearly  COVID vaccine - discussed, recommended, declines Tdap 2019  Varicella - only records of 1 vaccine  Pneumovax 2017 Seat belt use discussed Sunscreen use discussed. No changing moles on skin.  Smoking - none  Alcohol - social 1-2 beers/wk Rec drugs - none  Currently sexually active - 3 partners in past year, now with monogamous GF. 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 Wrestler.  Occ: now working for [employer] Activity: working out 3d/wk at gym Diet: good water daily, fruits/vegetables  Moved to [place] at age 61      Relevant past medical, surgical, family and social history reviewed and updated as indicated. Interim medical history since our last visit reviewed. Allergies and medications reviewed and updated. Outpatient Medications Prior to Visit  Medication Sig Dispense Refill   Continuous Glucose Sensor (FREESTYLE LIBRE 3 SENSOR) MISC Place 1 sensor on the skin every 14 days. Use to check glucose continuously 6 each 3   Glucagon  (GVOKE HYPOPEN  2-PACK) 1 MG/0.2ML SOAJ Inject 1 mg into the skin as needed (for symptomatic hypoglycemia). 0.4 mL 2   ibuprofen (ADVIL,MOTRIN) 200 MG tablet Take  200 mg by mouth as needed.     insulin  aspart (NOVOLOG ) 100 UNIT/ML FlexPen Sliding scale with meals 1 unit for every 50 over 150, max 30 units daily 15 mL 3   insulin  glargine (LANTUS ) 100 UNIT/ML Solostar Pen Inject 30 Units into the skin at bedtime. 15 mL 3   insulin  lispro (HUMALOG  KWIKPEN) 100 UNIT/ML KwikPen Sliding scale with meals 1 unit for every 50 over 150, max 30 units daily 15 mL 3   No facility-administered medications prior to visit.     Per HPI unless specifically indicated in ROS section below Review of Systems  Constitutional:  Negative for activity change, appetite change, chills, fatigue, fever and unexpected weight change.  HENT:  Negative for hearing loss.   Eyes:  Negative for visual disturbance.  Respiratory:  Negative for cough, chest tightness, shortness of breath and wheezing.   Cardiovascular:  Negative for chest pain, palpitations and leg swelling.  Gastrointestinal:  Negative for abdominal distention, abdominal pain, blood in stool, constipation, diarrhea, nausea and vomiting.  Genitourinary:  Negative for difficulty urinating and hematuria.  Musculoskeletal:  Negative for arthralgias, myalgias and neck pain.  Skin:  Negative for rash.  Neurological:  Negative for dizziness, seizures, syncope and headaches.  Hematological:  Negative for adenopathy. Does not bruise/bleed easily.  Psychiatric/Behavioral:  Negative for dysphoric mood. The patient is not nervous/anxious.     Objective:  There were no vitals taken for this visit.  Wt Readings from Last  3 Encounters:  07/29/23 174 lb 2 oz (79 kg)  12/06/20 179 lb 9 oz (81.4 kg)  04/03/20 179 lb 9 oz (81.4 kg)      Physical Exam Vitals and nursing note reviewed.  Constitutional:      General: He is not in acute distress.    Appearance: Normal appearance. He is well-developed. He is not ill-appearing.  HENT:     Head: Normocephalic and atraumatic.     Right Ear: Hearing, tympanic membrane, ear canal and  external ear normal.     Left Ear: Hearing, tympanic membrane, ear canal and external ear normal.  Eyes:     General: No scleral icterus.    Extraocular Movements: Extraocular movements intact.     Conjunctiva/sclera: Conjunctivae normal.     Pupils: Pupils are equal, round, and reactive to light.  Neck:     Thyroid : No thyroid  mass or thyromegaly.  Cardiovascular:     Rate and Rhythm: Normal rate and regular rhythm.     Pulses: Normal pulses.          Radial pulses are 2+ on the right side and 2+ on the left side.     Heart sounds: Normal heart sounds. No murmur heard. Pulmonary:     Effort: Pulmonary effort is normal. No respiratory distress.     Breath sounds: Normal breath sounds. No wheezing, rhonchi or rales.  Abdominal:     General: Bowel sounds are normal. There is no distension.     Palpations: Abdomen is soft. There is no mass.     Tenderness: There is no abdominal tenderness. There is no guarding or rebound.     Hernia: No hernia is present.  Musculoskeletal:        General: Normal range of motion.     Cervical back: Normal range of motion and neck supple.     Right lower leg: No edema.     Left lower leg: No edema.  Lymphadenopathy:     Cervical: No cervical adenopathy.  Skin:    General: Skin is warm and dry.     Findings: No rash.  Neurological:     General: No focal deficit present.     Mental Status: He is alert and oriented to person, place, and time.  Psychiatric:        Mood and Affect: Mood normal.        Behavior: Behavior normal.        Thought Content: Thought content normal.        Judgment: Judgment normal.       Results for orders placed or performed in visit on 07/29/23  POCT glycosylated hemoglobin (Hb A1C)   Collection Time: 07/29/23  3:26 PM  Result Value Ref Range   Hemoglobin A1C 7.1 (A) 4.0 - 5.6 %   HbA1c POC (<> result, manual entry)     HbA1c, POC (prediabetic range)     HbA1c, POC (controlled diabetic range)      Assessment &  Plan:   Problem List Items Addressed This Visit   None    No orders of the defined types were placed in this encounter.   No orders of the defined types were placed in this encounter.   There are no Patient Instructions on file for this visit.  Follow up plan: No follow-ups on file.  Anton Blas, MD

## 2024-04-12 ENCOUNTER — Other Ambulatory Visit: Payer: Self-pay | Admitting: Family Medicine

## 2024-04-12 DIAGNOSIS — E109 Type 1 diabetes mellitus without complications: Secondary | ICD-10-CM

## 2024-04-13 NOTE — Telephone Encounter (Signed)
 Lantus  Last filled:  02/08/24, #15 mL Last OV:  07/29/23, DM f/u Next OV:  none

## 2024-04-15 ENCOUNTER — Other Ambulatory Visit: Payer: Self-pay | Admitting: Family Medicine

## 2024-04-15 DIAGNOSIS — E109 Type 1 diabetes mellitus without complications: Secondary | ICD-10-CM

## 2024-04-15 NOTE — Telephone Encounter (Signed)
 Copied from CRM [id number]. Topic: Clinical - Medication Refill >> Apr 15, 2024  9:49 AM Vena HERO wrote: Medication: insulin  glargine (LANTUS ) 100 UNIT/ML Solostar Pen  Has the patient contacted their pharmacy? Yes (Agent: If no, request that the patient contact the pharmacy for the refill. If patient does not wish to contact the pharmacy document the reason why and proceed with request.) (Agent: If yes, when and what did the pharmacy advise?) No refills  This is the patient's preferred pharmacy:  [id number] [place] ([place]), [place] - [address] [place] ([place]) [place] [address] Phone: [phone] Fax: [phone]  Is this the correct pharmacy for this prescription? Yes If no, delete pharmacy and type the correct one.   Has the prescription been filled recently? No  Is the patient out of the medication? No  Has the patient been seen for an appointment in the last year OR does the patient have an upcoming appointment? Yes  Can we respond through MyChart? Yes  Agent: Please be advised that Rx refills may take up to 3 business days. We ask that you follow-up with your pharmacy.

## 2024-04-15 NOTE — Telephone Encounter (Signed)
 Duplicate request (see 04/12/24 refill note).   Unpinned request.

## 2024-04-16 ENCOUNTER — Ambulatory Visit: Payer: Self-pay

## 2024-04-16 NOTE — Telephone Encounter (Signed)
Refilled. Spoke with patient.

## 2024-04-16 NOTE — Telephone Encounter (Signed)
 ERx

## 2024-04-16 NOTE — Telephone Encounter (Signed)
 Patient reports currently blood sugar is 168. Does have plenty of shorter acting insulin  but is out of Lantus . Patient has called about medication requests. Asking for a follow up call today.  FYI Only or Action Required?: Action required by provider: medication refill request.  Patient was last seen in primary care on 07/29/2023 by Rilla Baller, MD.  Called Nurse Triage reporting Medication Problem.  Triage Disposition: Call PCP Now  Patient/caregiver understands and will follow disposition?: Yes  Copied from CRM #[id number]. Topic: Clinical - Red Word Triage >> Apr 16, 2024 12:40 PM Rea BROCKS wrote: Red Word that prompted transfer to Nurse Triage: High blood sugar Reason for Disposition  [1] Caller has URGENT medicine question about med that primary care doctor (or NP/PA) or specialist prescribed AND [2] triager unable to answer question  Answer Assessment - Initial Assessment Questions 1. NAME of MEDICINE: What medicine(s) are you calling about?     Lantus  2. QUESTION: What is your question? (e.g., double dose of medicine, side effect)     Patient is out of his Lantus . He has called a couple of times about his lantus  3. PRESCRIBER: Who prescribed the medicine? Reason: if prescribed by specialist, call should be referred to that group.     Gutierrez 4. SYMPTOMS: Do you have any symptoms? If Yes, ask: What symptoms are you having?  How bad are the symptoms (e.g., mild, moderate, severe)     No symptoms  Protocols used: Medication Question Call-A-AH

## 2024-04-19 ENCOUNTER — Telehealth: Payer: Self-pay

## 2024-04-19 NOTE — Telephone Encounter (Signed)
 Copied from CRM [id number]. Topic: General - Other >> Apr 16, 2024  4:48 PM Jasmin G wrote: Reason for CRM: Pt called to check on the status of his prescription refill for Lantus , I called [facility] and was told that Dr. Rilla would not be able to approve refill due to being out of the office for the day, pt then wanted to relay a message stating that he has called regarding prescription refill multiple times, he's a diabetic and is out of this med despite attempts to get it refilled.

## 2024-04-20 NOTE — Telephone Encounter (Signed)
 Left message to return call to our office. After leaving message Dr. KANDICE informed me he talked to patient on Friday. No further action needed on this chart.

## 2024-05-04 ENCOUNTER — Ambulatory Visit: Admitting: Family Medicine

## 2024-05-19 ENCOUNTER — Encounter: Payer: Self-pay | Admitting: Family Medicine

## 2024-05-19 ENCOUNTER — Ambulatory Visit: Admitting: Family Medicine

## 2024-05-19 VITALS — BP 138/84 | HR 86 | Temp 98.7°F | Ht 70.0 in | Wt 175.1 lb

## 2024-05-19 DIAGNOSIS — Z0000 Encounter for general adult medical examination without abnormal findings: Secondary | ICD-10-CM

## 2024-05-19 DIAGNOSIS — Z23 Encounter for immunization: Secondary | ICD-10-CM

## 2024-05-19 DIAGNOSIS — Z8249 Family history of ischemic heart disease and other diseases of the circulatory system: Secondary | ICD-10-CM | POA: Diagnosis not present

## 2024-05-19 DIAGNOSIS — E109 Type 1 diabetes mellitus without complications: Secondary | ICD-10-CM

## 2024-05-19 LAB — POCT GLYCOSYLATED HEMOGLOBIN (HGB A1C): Hemoglobin A1C: 6.8 % — AB (ref 4.0–5.6)

## 2024-05-19 MED ORDER — LANTUS SOLOSTAR 100 UNIT/ML ~~LOC~~ SOPN: 30.0000 [IU] | PEN_INJECTOR | Freq: Every day | SUBCUTANEOUS | 1 refills | Status: AC

## 2024-05-19 NOTE — Assessment & Plan Note (Addendum)
 Father deceased from MI age 29yo Update Lp(a) and further lipid panel

## 2024-05-19 NOTE — Patient Instructions (Addendum)
 Flu shot today Labs today  Schedule diabetic eye exam  Vision screen today  I do want you to see diabetes doctor - new referral placed to [facility]. You may call them at [phone]  Return as needed or in 1 year for next physical.  Good to see you today

## 2024-05-19 NOTE — Progress Notes (Signed)
 Ph: [phone] Fax: [phone]   Patient ID: Patrick Williams, male    DOB: 07-May-1996, 28 y.o.   MRN: [id number]  This visit was conducted in person.  BP 138/84   Pulse 86   Temp 98.7 F (37.1 C) (Oral)   Ht 5' 10 (1.778 m)   Wt 175 lb 2 oz (79.4 kg)   SpO2 98%   BMI 25.13 kg/m   BP Readings from Last 3 Encounters:  05/19/24 138/84  07/29/23 136/80  12/06/20 120/70   Vision Screening   Right eye Left eye Both eyes  Without correction 20/20 2015 2013  With correction        CC: CPE Subjective:   HPI: Patrick Williams is a 28 y.o. male presenting on 05/19/2024 for Medical Management of Chronic Issues (Pt here for check up)   Type 1 diabetic diagnosed 1999, previously saw [facility] endocrinology Norleen [place] PA, last seen 2020. Briefly saw [facility] endo 2022. Needs new local endocrinologist.   Continues basal insulin  30u - finds lantus  works better than basaglar .  Also on Novolog  regular insulin  sliding scale - 1:12 ratio, with SSI 1 unit every 50 over 150. Averaging ~25 units Novolog  per day.  Has been using glucometer premiere blue relion - sugars averaging 150-200s.  Not currently using CGM.  Lab Results  Component Value Date   HGBA1C 6.8 (A) 05/19/2024    Preventative: Flu shot yearly  COVID vaccine - discussed, recommended, declines Tdap 2019  Varicella - only records of 1 vaccine - however titers were positive.  Pneumovax 2017 Seat belt use discussed Sunscreen use discussed. No changing moles on skin.  Smoking - none, occ vaping planning to quit  Alcohol - social 1-2 beers/wk Rec drugs - none  Currently sexually active - 1 partner in past year, has monogamous GF.  Dentist q6 mo  Eye exam yearly - due   Lives with GF, 2 dogs Edu: [employer], [employer] studying kinesiology. Wrestler.  Occ: working for [employer] Hobbies:  wrestling Activity: working out 3d/wk at gym  Diet: good water daily, fruits/vegetables.  Born in [place] - moved to [place]  at age 28      Relevant past medical, surgical, family and social history reviewed and updated as indicated. Interim medical history since our last visit reviewed. Allergies and medications reviewed and updated. Outpatient Medications Prior to Visit  Medication Sig Dispense Refill   Glucagon  (GVOKE HYPOPEN  2-PACK) 1 MG/0.2ML SOAJ Inject 1 mg into the skin as needed (for symptomatic hypoglycemia). 0.4 mL 2   ibuprofen (ADVIL,MOTRIN) 200 MG tablet Take 200 mg by mouth as needed.     insulin  aspart (NOVOLOG ) 100 UNIT/ML FlexPen Sliding scale with meals 1 unit for every 50 over 150, max 30 units daily 15 mL 3   LANTUS  SOLOSTAR 100 UNIT/ML Solostar Pen INJECT 30 UNITS SUBCUTANEOUSLY AT BEDTIME 15 mL 0   Continuous Glucose Sensor (FREESTYLE LIBRE 3 SENSOR) MISC Place 1 sensor on the skin every 14 days. Use to check glucose continuously (Patient not taking: Reported on 05/19/2024) 6 each 3   insulin  lispro (HUMALOG  KWIKPEN) 100 UNIT/ML KwikPen Sliding scale with meals 1 unit for every 50 over 150, max 30 units daily (Patient not taking: Reported on 05/19/2024) 15 mL 3   No facility-administered medications prior to visit.     Per HPI unless specifically indicated in ROS section below Review of Systems  Constitutional:  Negative for activity change, appetite change, chills, fatigue, fever and  unexpected weight change.  HENT:  Negative for hearing loss.   Eyes:  Negative for visual disturbance.  Respiratory:  Negative for cough, chest tightness, shortness of breath and wheezing.   Cardiovascular:  Negative for chest pain, palpitations and leg swelling.  Gastrointestinal:  Negative for abdominal distention, abdominal pain, blood in stool, constipation, diarrhea, nausea and vomiting.  Genitourinary:  Negative for difficulty urinating and hematuria.  Musculoskeletal:  Negative for arthralgias, myalgias and neck pain.  Skin:  Negative for rash.  Neurological:  Negative for dizziness, seizures, syncope  and headaches.  Hematological:  Negative for adenopathy. Does not bruise/bleed easily.  Psychiatric/Behavioral:  Negative for dysphoric mood. The patient is not nervous/anxious.     Objective:  BP 138/84   Pulse 86   Temp 98.7 F (37.1 C) (Oral)   Ht 5' 10 (1.778 m)   Wt 175 lb 2 oz (79.4 kg)   SpO2 98%   BMI 25.13 kg/m   Wt Readings from Last 3 Encounters:  05/19/24 175 lb 2 oz (79.4 kg)  07/29/23 174 lb 2 oz (79 kg)  12/06/20 179 lb 9 oz (81.4 kg)      Physical Exam Vitals and nursing note reviewed.  Constitutional:      General: He is not in acute distress.    Appearance: Normal appearance. He is well-developed. He is not ill-appearing.  HENT:     Head: Normocephalic and atraumatic.     Right Ear: Hearing, tympanic membrane, ear canal and external ear normal.     Left Ear: Hearing, tympanic membrane, ear canal and external ear normal.     Mouth/Throat:     Mouth: Mucous membranes are moist.     Pharynx: Oropharynx is clear. No oropharyngeal exudate or posterior oropharyngeal erythema.  Eyes:     General: No scleral icterus.    Extraocular Movements: Extraocular movements intact.     Conjunctiva/sclera: Conjunctivae normal.     Pupils: Pupils are equal, round, and reactive to light.  Neck:     Thyroid : No thyroid  mass or thyromegaly.  Cardiovascular:     Rate and Rhythm: Normal rate and regular rhythm.     Pulses: Normal pulses.          Radial pulses are 2+ on the right side and 2+ on the left side.     Heart sounds: Normal heart sounds. No murmur heard. Pulmonary:     Effort: Pulmonary effort is normal. No respiratory distress.     Breath sounds: Normal breath sounds. No wheezing, rhonchi or rales.  Abdominal:     General: Bowel sounds are normal. There is no distension.     Palpations: Abdomen is soft. There is no mass.     Tenderness: There is no abdominal tenderness. There is no guarding or rebound.     Hernia: No hernia is present.  Musculoskeletal:         General: Normal range of motion.     Cervical back: Normal range of motion and neck supple.     Right lower leg: No edema.     Left lower leg: No edema.  Lymphadenopathy:     Cervical: No cervical adenopathy.  Skin:    General: Skin is warm and dry.     Findings: No rash.  Neurological:     General: No focal deficit present.     Mental Status: He is alert and oriented to person, place, and time.  Psychiatric:        Mood and  Affect: Mood normal.        Behavior: Behavior normal.        Thought Content: Thought content normal.        Judgment: Judgment normal.       Results for orders placed or performed in visit on 05/19/24  POCT glycosylated hemoglobin (Hb A1C)   Collection Time: 05/19/24  3:24 PM  Result Value Ref Range   Hemoglobin A1C 6.8 (A) 4.0 - 5.6 %   HbA1c POC (<> result, manual entry)     HbA1c, POC (prediabetic range)     HbA1c, POC (controlled diabetic range)      Assessment & Plan:   Problem List Items Addressed This Visit     Health maintenance examination - Primary (Chronic)   Preventative protocols reviewed and updated unless pt declined. Discussed healthy diet and lifestyle.  Declined STD screen      Type 1 diabetes mellitus (HCC)   A1c 6.8%. notes occ lows. Has Gvoke glucagon  pen.  Continue current insulin  regimen of lantus  30u nightly with Novolog  mealtime and sliding scale.  Recommend schedule diabetic eye exam.  Will refer again to [facility] endocrinology, # provided to call and schedule appt.       Relevant Medications   insulin  glargine (LANTUS  SOLOSTAR) 100 UNIT/ML Solostar Pen   Other Relevant Orders   Lipid panel   Comprehensive metabolic panel with GFR   TSH   Microalbumin / creatinine urine ratio   Lipoprotein A (LPA)   Ambulatory referral to Ophthalmology   Ambulatory referral to Endocrinology   POCT glycosylated hemoglobin (Hb A1C) (Completed)   Family history of premature CAD   Father deceased from MI age 77yo Update Lp(a) and  further lipid panel      Relevant Orders   Lipoprotein A (LPA)     Meds ordered this encounter  Medications   insulin  glargine (LANTUS  SOLOSTAR) 100 UNIT/ML Solostar Pen    Sig: Inject 30 Units into the skin at bedtime.    Dispense:  30 mL    Refill:  1    Orders Placed This Encounter  Procedures   Lipid panel   Comprehensive metabolic panel with GFR   TSH   Microalbumin / creatinine urine ratio   Lipoprotein A (LPA)   Ambulatory referral to Ophthalmology    Referral Priority:   Routine    Referral Type:   Consultation    Referral Reason:   Specialty Services Required    Requested Specialty:   Ophthalmology    Number of Visits Requested:   1   Ambulatory referral to Endocrinology    Referral Priority:   Routine    Referral Type:   Consultation    Referral Reason:   Specialty Services Required    Number of Visits Requested:   1   POCT glycosylated hemoglobin (Hb A1C)    Patient Instructions  Flu shot today Labs today  Schedule diabetic eye exam  Vision screen today  I do want you to see diabetes doctor - new referral placed to [place] endocrinology. You may call them at [phone]  Return as needed or in 1 year for next physical.  Good to see you today   Follow up plan: Return in about 1 year (around 05/19/2025) for annual exam, prior fasting for blood work.  Anton Blas, MD

## 2024-05-19 NOTE — Assessment & Plan Note (Addendum)
 A1c 6.8%. notes occ lows. Has Gvoke glucagon  pen.  Continue current insulin  regimen of lantus  30u nightly with Novolog  mealtime and sliding scale.  Recommend schedule diabetic eye exam.  Will refer again to [facility] endocrinology, # provided to call and schedule appt.

## 2024-05-19 NOTE — Assessment & Plan Note (Addendum)
 Preventative protocols reviewed and updated unless pt declined. Discussed healthy diet and lifestyle.  Declined STD screen

## 2024-05-20 LAB — COMPREHENSIVE METABOLIC PANEL WITH GFR
ALT: 14 U/L (ref 0–53)
AST: 15 U/L (ref 0–37)
Albumin: 4.4 g/dL (ref 3.5–5.2)
Alkaline Phosphatase: 55 U/L (ref 39–117)
BUN: 12 mg/dL (ref 6–23)
CO2: 32 meq/L (ref 19–32)
Calcium: 9.4 mg/dL (ref 8.4–10.5)
Chloride: 103 meq/L (ref 96–112)
Creatinine, Ser: 1.21 mg/dL (ref 0.40–1.50)
GFR: 81.24 mL/min (ref >60.00)
Glucose, Bld: 94 mg/dL (ref 70–99)
Potassium: 4 meq/L (ref 3.5–5.1)
Sodium: 142 meq/L (ref 135–145)
Total Bilirubin: 0.6 mg/dL (ref 0.2–1.2)
Total Protein: 6.7 g/dL (ref 6.0–8.3)

## 2024-05-20 LAB — MICROALBUMIN / CREATININE URINE RATIO
Creatinine,U: 195.7 mg/dL
Microalb Creat Ratio: 8.3 mg/g (ref 0.0–30.0)
Microalb, Ur: 1.6 mg/dL (ref 0.0–1.9)

## 2024-05-20 LAB — LIPID PANEL
Cholesterol: 164 mg/dL (ref 0–200)
HDL: 65.2 mg/dL (ref >39.00)
LDL Cholesterol: 85 mg/dL (ref 0–99)
NonHDL: 98.32
Total CHOL/HDL Ratio: 3
Triglycerides: 68 mg/dL (ref 0.0–149.0)
VLDL: 13.6 mg/dL (ref 0.0–40.0)

## 2024-05-20 LAB — TSH: TSH: 1.23 u[IU]/mL (ref 0.35–5.50)

## 2024-05-22 LAB — LIPOPROTEIN A (LPA): Lipoprotein (a): 55 nmol/L (ref <75)

## 2024-05-24 ENCOUNTER — Ambulatory Visit: Payer: Self-pay | Admitting: Family Medicine

## 2024-05-24 DIAGNOSIS — Z8249 Family history of ischemic heart disease and other diseases of the circulatory system: Secondary | ICD-10-CM

## 2024-11-10 ENCOUNTER — Ambulatory Visit: Admitting: Endocrinology

## 2025-05-16 ENCOUNTER — Other Ambulatory Visit

## 2025-05-23 ENCOUNTER — Encounter: Admitting: Family Medicine
